# Patient Record
Sex: Female | Born: 1960
Health system: Southern US, Community
[De-identification: ages and names within clinical notes are randomized; demographics above are authoritative.]

## PROBLEM LIST (undated history)

## (undated) DIAGNOSIS — I10 Essential (primary) hypertension: Secondary | ICD-10-CM

## (undated) DIAGNOSIS — E119 Type 2 diabetes mellitus without complications: Secondary | ICD-10-CM

## (undated) DIAGNOSIS — E785 Hyperlipidemia, unspecified: Secondary | ICD-10-CM

## (undated) HISTORY — DX: Hyperlipidemia, unspecified: E78.5

## (undated) HISTORY — DX: Type 2 diabetes mellitus without complications: E11.9

## (undated) HISTORY — DX: Essential (primary) hypertension: I10

---

## 2018-09-06 DIAGNOSIS — H01004 Unspecified blepharitis left upper eyelid: Secondary | ICD-10-CM | POA: Diagnosis not present

## 2018-09-06 DIAGNOSIS — H01001 Unspecified blepharitis right upper eyelid: Secondary | ICD-10-CM | POA: Diagnosis not present

## 2018-09-06 DIAGNOSIS — H2513 Age-related nuclear cataract, bilateral: Secondary | ICD-10-CM | POA: Diagnosis not present

## 2018-09-06 DIAGNOSIS — H01002 Unspecified blepharitis right lower eyelid: Secondary | ICD-10-CM | POA: Diagnosis not present

## 2018-10-31 DIAGNOSIS — E785 Hyperlipidemia, unspecified: Secondary | ICD-10-CM | POA: Diagnosis not present

## 2018-10-31 DIAGNOSIS — I10 Essential (primary) hypertension: Secondary | ICD-10-CM | POA: Diagnosis not present

## 2018-11-22 DIAGNOSIS — F332 Major depressive disorder, recurrent severe without psychotic features: Secondary | ICD-10-CM | POA: Diagnosis not present

## 2019-01-22 DIAGNOSIS — E785 Hyperlipidemia, unspecified: Secondary | ICD-10-CM | POA: Diagnosis not present

## 2019-01-22 DIAGNOSIS — I10 Essential (primary) hypertension: Secondary | ICD-10-CM | POA: Diagnosis not present

## 2019-02-25 DIAGNOSIS — E782 Mixed hyperlipidemia: Secondary | ICD-10-CM | POA: Diagnosis not present

## 2019-02-25 DIAGNOSIS — I10 Essential (primary) hypertension: Secondary | ICD-10-CM | POA: Diagnosis not present

## 2019-04-24 DIAGNOSIS — I10 Essential (primary) hypertension: Secondary | ICD-10-CM | POA: Diagnosis not present

## 2019-04-24 DIAGNOSIS — E785 Hyperlipidemia, unspecified: Secondary | ICD-10-CM | POA: Diagnosis not present

## 2019-04-24 DIAGNOSIS — Z23 Encounter for immunization: Secondary | ICD-10-CM | POA: Diagnosis not present

## 2019-04-24 DIAGNOSIS — M545 Low back pain: Secondary | ICD-10-CM | POA: Diagnosis not present

## 2019-04-24 DIAGNOSIS — Z1211 Encounter for screening for malignant neoplasm of colon: Secondary | ICD-10-CM | POA: Diagnosis not present

## 2019-04-24 DIAGNOSIS — E782 Mixed hyperlipidemia: Secondary | ICD-10-CM | POA: Diagnosis not present

## 2019-05-02 DIAGNOSIS — Z1321 Encounter for screening for nutritional disorder: Secondary | ICD-10-CM | POA: Diagnosis not present

## 2019-05-02 DIAGNOSIS — R7303 Prediabetes: Secondary | ICD-10-CM | POA: Diagnosis not present

## 2019-05-02 DIAGNOSIS — E668 Other obesity: Secondary | ICD-10-CM | POA: Diagnosis not present

## 2019-05-02 DIAGNOSIS — L409 Psoriasis, unspecified: Secondary | ICD-10-CM | POA: Diagnosis not present

## 2019-05-13 DIAGNOSIS — E782 Mixed hyperlipidemia: Secondary | ICD-10-CM | POA: Diagnosis not present

## 2019-05-13 DIAGNOSIS — Z0001 Encounter for general adult medical examination with abnormal findings: Secondary | ICD-10-CM | POA: Diagnosis not present

## 2019-05-13 DIAGNOSIS — I10 Essential (primary) hypertension: Secondary | ICD-10-CM | POA: Diagnosis not present

## 2019-05-13 DIAGNOSIS — Z833 Family history of diabetes mellitus: Secondary | ICD-10-CM | POA: Diagnosis not present

## 2019-05-18 ENCOUNTER — Other Ambulatory Visit: Payer: Self-pay | Admitting: Family Medicine

## 2019-05-18 DIAGNOSIS — Z1231 Encounter for screening mammogram for malignant neoplasm of breast: Secondary | ICD-10-CM

## 2019-05-25 DIAGNOSIS — F332 Major depressive disorder, recurrent severe without psychotic features: Secondary | ICD-10-CM | POA: Diagnosis not present

## 2019-06-06 DIAGNOSIS — Z20828 Contact with and (suspected) exposure to other viral communicable diseases: Secondary | ICD-10-CM | POA: Diagnosis not present

## 2019-06-23 DIAGNOSIS — Z124 Encounter for screening for malignant neoplasm of cervix: Secondary | ICD-10-CM | POA: Diagnosis not present

## 2019-06-23 DIAGNOSIS — Z130 Encounter for screening for diseases of the blood and blood-forming organs and certain disorders involving the immune mechanism: Secondary | ICD-10-CM | POA: Diagnosis not present

## 2019-06-23 DIAGNOSIS — Z6835 Body mass index (BMI) 35.0-35.9, adult: Secondary | ICD-10-CM | POA: Diagnosis not present

## 2019-06-23 DIAGNOSIS — Z01419 Encounter for gynecological examination (general) (routine) without abnormal findings: Secondary | ICD-10-CM | POA: Diagnosis not present

## 2019-07-11 ENCOUNTER — Ambulatory Visit
Admission: RE | Admit: 2019-07-11 | Discharge: 2019-07-11 | Disposition: A | Discharge status: Home / Self Care | Payer: BC Managed Care – PPO | Source: Ambulatory Visit | Attending: Family Medicine | Admitting: Family Medicine

## 2019-07-11 ENCOUNTER — Other Ambulatory Visit: Payer: Self-pay

## 2019-07-11 DIAGNOSIS — Z1231 Encounter for screening mammogram for malignant neoplasm of breast: Secondary | ICD-10-CM | POA: Diagnosis not present

## 2019-07-24 DIAGNOSIS — I10 Essential (primary) hypertension: Secondary | ICD-10-CM | POA: Diagnosis not present

## 2019-07-24 DIAGNOSIS — R7303 Prediabetes: Secondary | ICD-10-CM | POA: Diagnosis not present

## 2019-07-24 DIAGNOSIS — Z833 Family history of diabetes mellitus: Secondary | ICD-10-CM | POA: Diagnosis not present

## 2019-07-24 DIAGNOSIS — E782 Mixed hyperlipidemia: Secondary | ICD-10-CM | POA: Diagnosis not present

## 2019-08-09 DIAGNOSIS — Z833 Family history of diabetes mellitus: Secondary | ICD-10-CM | POA: Diagnosis not present

## 2019-08-09 DIAGNOSIS — I10 Essential (primary) hypertension: Secondary | ICD-10-CM | POA: Diagnosis not present

## 2019-08-09 DIAGNOSIS — R7303 Prediabetes: Secondary | ICD-10-CM | POA: Diagnosis not present

## 2019-08-09 DIAGNOSIS — E782 Mixed hyperlipidemia: Secondary | ICD-10-CM | POA: Diagnosis not present

## 2019-10-11 DIAGNOSIS — I10 Essential (primary) hypertension: Secondary | ICD-10-CM | POA: Diagnosis not present

## 2019-10-11 DIAGNOSIS — R7303 Prediabetes: Secondary | ICD-10-CM | POA: Diagnosis not present

## 2019-10-11 DIAGNOSIS — E7800 Pure hypercholesterolemia, unspecified: Secondary | ICD-10-CM | POA: Diagnosis not present

## 2019-10-19 DIAGNOSIS — H2513 Age-related nuclear cataract, bilateral: Secondary | ICD-10-CM | POA: Diagnosis not present

## 2019-10-19 DIAGNOSIS — H524 Presbyopia: Secondary | ICD-10-CM | POA: Diagnosis not present

## 2019-10-19 DIAGNOSIS — H0100A Unspecified blepharitis right eye, upper and lower eyelids: Secondary | ICD-10-CM | POA: Diagnosis not present

## 2019-10-19 DIAGNOSIS — E119 Type 2 diabetes mellitus without complications: Secondary | ICD-10-CM | POA: Diagnosis not present

## 2019-11-09 DIAGNOSIS — I10 Essential (primary) hypertension: Secondary | ICD-10-CM | POA: Diagnosis not present

## 2019-11-09 DIAGNOSIS — R7303 Prediabetes: Secondary | ICD-10-CM | POA: Diagnosis not present

## 2019-11-09 DIAGNOSIS — Z23 Encounter for immunization: Secondary | ICD-10-CM | POA: Diagnosis not present

## 2019-11-09 DIAGNOSIS — Z1389 Encounter for screening for other disorder: Secondary | ICD-10-CM | POA: Diagnosis not present

## 2019-11-16 DIAGNOSIS — H6122 Impacted cerumen, left ear: Secondary | ICD-10-CM | POA: Diagnosis not present

## 2019-11-22 DIAGNOSIS — F339 Major depressive disorder, recurrent, unspecified: Secondary | ICD-10-CM | POA: Diagnosis not present

## 2019-11-22 DIAGNOSIS — Z79899 Other long term (current) drug therapy: Secondary | ICD-10-CM | POA: Diagnosis not present

## 2020-01-13 DIAGNOSIS — H6120 Impacted cerumen, unspecified ear: Secondary | ICD-10-CM | POA: Diagnosis not present

## 2020-02-16 DIAGNOSIS — Z23 Encounter for immunization: Secondary | ICD-10-CM | POA: Diagnosis not present

## 2020-03-31 DIAGNOSIS — Z20822 Contact with and (suspected) exposure to covid-19: Secondary | ICD-10-CM | POA: Diagnosis not present

## 2020-04-26 DIAGNOSIS — R7303 Prediabetes: Secondary | ICD-10-CM | POA: Diagnosis not present

## 2020-04-26 DIAGNOSIS — Z23 Encounter for immunization: Secondary | ICD-10-CM | POA: Diagnosis not present

## 2020-04-26 DIAGNOSIS — I10 Essential (primary) hypertension: Secondary | ICD-10-CM | POA: Diagnosis not present

## 2020-04-26 DIAGNOSIS — L308 Other specified dermatitis: Secondary | ICD-10-CM | POA: Diagnosis not present

## 2020-04-26 DIAGNOSIS — E7800 Pure hypercholesterolemia, unspecified: Secondary | ICD-10-CM | POA: Diagnosis not present

## 2020-05-07 DIAGNOSIS — T148XXA Other injury of unspecified body region, initial encounter: Secondary | ICD-10-CM | POA: Diagnosis not present

## 2020-05-07 DIAGNOSIS — R079 Chest pain, unspecified: Secondary | ICD-10-CM | POA: Diagnosis not present

## 2020-05-10 DIAGNOSIS — F411 Generalized anxiety disorder: Secondary | ICD-10-CM | POA: Diagnosis not present

## 2020-05-10 DIAGNOSIS — F332 Major depressive disorder, recurrent severe without psychotic features: Secondary | ICD-10-CM | POA: Diagnosis not present

## 2020-05-10 DIAGNOSIS — Z79899 Other long term (current) drug therapy: Secondary | ICD-10-CM | POA: Diagnosis not present

## 2020-06-26 DIAGNOSIS — Z01419 Encounter for gynecological examination (general) (routine) without abnormal findings: Secondary | ICD-10-CM | POA: Diagnosis not present

## 2020-06-26 DIAGNOSIS — Z1389 Encounter for screening for other disorder: Secondary | ICD-10-CM | POA: Diagnosis not present

## 2020-06-26 DIAGNOSIS — Z1231 Encounter for screening mammogram for malignant neoplasm of breast: Secondary | ICD-10-CM | POA: Diagnosis not present

## 2020-06-26 DIAGNOSIS — Z130 Encounter for screening for diseases of the blood and blood-forming organs and certain disorders involving the immune mechanism: Secondary | ICD-10-CM | POA: Diagnosis not present

## 2020-06-26 DIAGNOSIS — Z780 Asymptomatic menopausal state: Secondary | ICD-10-CM | POA: Diagnosis not present

## 2020-07-04 ENCOUNTER — Other Ambulatory Visit: Payer: Self-pay | Admitting: Obstetrics and Gynecology

## 2020-07-04 DIAGNOSIS — R928 Other abnormal and inconclusive findings on diagnostic imaging of breast: Secondary | ICD-10-CM

## 2020-07-05 ENCOUNTER — Other Ambulatory Visit: Payer: Self-pay

## 2020-07-05 ENCOUNTER — Ambulatory Visit: Payer: BC Managed Care – PPO

## 2020-07-05 ENCOUNTER — Other Ambulatory Visit: Payer: Self-pay | Admitting: Obstetrics and Gynecology

## 2020-07-05 ENCOUNTER — Ambulatory Visit
Admission: RE | Admit: 2020-07-05 | Discharge: 2020-07-05 | Disposition: A | Discharge status: Home / Self Care | Payer: BC Managed Care – PPO | Source: Ambulatory Visit | Attending: Obstetrics and Gynecology | Admitting: Obstetrics and Gynecology

## 2020-07-05 DIAGNOSIS — R928 Other abnormal and inconclusive findings on diagnostic imaging of breast: Secondary | ICD-10-CM

## 2020-07-05 DIAGNOSIS — R921 Mammographic calcification found on diagnostic imaging of breast: Secondary | ICD-10-CM | POA: Diagnosis not present

## 2020-07-20 ENCOUNTER — Other Ambulatory Visit: Payer: Self-pay

## 2020-07-20 ENCOUNTER — Other Ambulatory Visit: Payer: Self-pay | Admitting: Physician Assistant

## 2020-07-20 ENCOUNTER — Ambulatory Visit
Admission: RE | Admit: 2020-07-20 | Discharge: 2020-07-20 | Disposition: A | Discharge status: Home / Self Care | Payer: BC Managed Care – PPO | Source: Ambulatory Visit | Attending: Obstetrics and Gynecology | Admitting: Obstetrics and Gynecology

## 2020-07-20 ENCOUNTER — Ambulatory Visit
Admission: RE | Admit: 2020-07-20 | Discharge: 2020-07-20 | Disposition: A | Discharge status: Home / Self Care | Payer: BC Managed Care – PPO | Source: Ambulatory Visit | Attending: Physician Assistant | Admitting: Physician Assistant

## 2020-07-20 DIAGNOSIS — R921 Mammographic calcification found on diagnostic imaging of breast: Secondary | ICD-10-CM

## 2020-07-20 DIAGNOSIS — M25551 Pain in right hip: Secondary | ICD-10-CM | POA: Diagnosis not present

## 2020-07-20 DIAGNOSIS — R52 Pain, unspecified: Secondary | ICD-10-CM

## 2020-07-20 DIAGNOSIS — M1611 Unilateral primary osteoarthritis, right hip: Secondary | ICD-10-CM | POA: Diagnosis not present

## 2020-07-20 DIAGNOSIS — M545 Low back pain, unspecified: Secondary | ICD-10-CM | POA: Diagnosis not present

## 2020-07-20 DIAGNOSIS — R928 Other abnormal and inconclusive findings on diagnostic imaging of breast: Secondary | ICD-10-CM | POA: Diagnosis not present

## 2020-07-20 DIAGNOSIS — M160 Bilateral primary osteoarthritis of hip: Secondary | ICD-10-CM | POA: Diagnosis not present

## 2020-07-20 DIAGNOSIS — N6011 Diffuse cystic mastopathy of right breast: Secondary | ICD-10-CM | POA: Diagnosis not present

## 2020-07-20 DIAGNOSIS — R920 Mammographic microcalcification found on diagnostic imaging of breast: Secondary | ICD-10-CM | POA: Diagnosis not present

## 2020-08-13 ENCOUNTER — Other Ambulatory Visit: Payer: Self-pay

## 2020-08-13 ENCOUNTER — Ambulatory Visit: Payer: BC Managed Care – PPO | Attending: Physician Assistant | Admitting: Physical Therapy

## 2020-08-13 ENCOUNTER — Encounter: Payer: Self-pay | Admitting: Physical Therapy

## 2020-08-13 DIAGNOSIS — R2689 Other abnormalities of gait and mobility: Secondary | ICD-10-CM | POA: Insufficient documentation

## 2020-08-13 DIAGNOSIS — G8929 Other chronic pain: Secondary | ICD-10-CM | POA: Diagnosis not present

## 2020-08-13 DIAGNOSIS — M5441 Lumbago with sciatica, right side: Secondary | ICD-10-CM | POA: Insufficient documentation

## 2020-08-13 DIAGNOSIS — M25551 Pain in right hip: Secondary | ICD-10-CM | POA: Diagnosis not present

## 2020-08-14 ENCOUNTER — Encounter: Payer: Self-pay | Admitting: Physical Therapy

## 2020-08-14 NOTE — Therapy (Signed)
[facility] [address] [place], [place], [address] Phone: [phone]   Fax:  [phone]  Physical Therapy Evaluation  Patient Details  Name: Sara Andersen MRN: [id number] Date of Birth: 09-12-1960 Referring Provider (PT): Mosetta Putt PA   Encounter Date: 08/13/2020   PT End of Session - 08/13/20 1602    Visit Number 1    Number of Visits 12    Date for PT Re-Evaluation 09/24/20    PT Start Time 1545    PT Stop Time 1638    PT Time Calculation (min) 53 min    Activity Tolerance Patient tolerated treatment well    Behavior During Therapy [facility] for tasks assessed/performed           Past Medical History:  Diagnosis Date  . Diabetes mellitus without complication (HCC)   . Hyperlipidemia   . Hypertension     History reviewed. No pertinent surgical history.  There were no vitals filed for this visit.    Subjective Assessment - 08/13/20 1551    Subjective For the past 2 yrears the patient has been expieincing Low back apnd right hip pain. She has a catching at times when she walks. She can also stiffne if she stits for too long.    Limitations Sitting;Lifting;Standing;Walking    Diagnostic tests Thea Silversmith spine: mild to moderate scoliosis; L2-L3 L3 L4 degeneration: Right hip: mild degeenration    Patient Stated Goals to have less pain. To prevent further deteriation of mobility    Currently in Pain? Yes    Pain Score 5     Pain Location Hip    Pain Orientation Right    Pain Descriptors / Indicators Aching    Pain Type Chronic pain    Pain Onset More than a month ago    Pain Frequency Constant    Aggravating Factors  standing, walking, sitting for too long    Pain Relieving Factors advil, and ice    Effect of Pain on Daily Activities difficulty perfroming daily activity at times              [facility] PT Assessment - 08/14/20 0001      Assessment   Medical Diagnosis Right Hip and Low Back pain    Referring  Provider (PT) Deidre Ala Clelland PA    Onset Date/Surgical Date --   > 2 years   Hand Dominance Right    Next MD Visit May 11th    Prior Therapy None      Precautions   Precautions None      Restrictions   Weight Bearing Restrictions No      Balance Screen   Has the patient fallen in the past 6 months No    Has the patient had a decrease in activity level because of a fear of falling?  No    Is the patient reluctant to leave their home because of a fear of falling?  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    Leisure nothing      Cognition   Overall Cognitive Status Within Functional Limits for tasks assessed    Attention Focused    Memory Appears  intact    Awareness Appears intact    Problem Solving Appears intact      Observation/Other Assessments   Observations Right shoulder elevation    Scoliosis noted in x-ray    Focus on Therapeutic Outcomes (FOTO)  55%  ABILITY 70 % EXPECTED      Sensation   Light Touch Appears Intact    Additional Comments denies parathesias; pain can radiate into the right leg      Coordination   Gross Motor Movements are Fluid and Coordinated Yes    Fine Motor Movements are Fluid and Coordinated Yes      AROM   Overall AROM Comments pain with active hip flexion on the right. pain with active hip IR      PROM   Overall PROM Comments pain at 80 degrees of hip flexion but able to move to 110 degrees      Strength   Strength Assessment Site Hip    Right/Left Hip Right;Left    Right Hip Flexion 4+/5    Right Hip ABduction 4+/5    Left Hip Flexion 5/5    Left Hip ABduction 5/5      Palpation   Palpation comment tender to palpation in the right gresater trochanter; tender to palpation in the gluteal       Special Tests   Other special tests Hip scour test (-)      Ambulation/Gait   Gait Comments mild hiking of the right hip with ambualtion, mild lateral movement                      Objective measurements completed on examination: See above findings.       [facility] Adult PT Treatment/Exercise - 08/14/20 0001      Lumbar Exercises: Stretches   Lower Trunk Rotation Limitations x20    Piriformis Stretch Limitations 2x20 sec hold      Lumbar Exercises: Supine   Other Supine Lumbar Exercises bridge  x20                  PT Education - 08/14/20 0810    Education Details reviewed HEP and symptom mangement    Person(s) Educated Patient    Methods Explanation;Demonstration;Verbal cues;Tactile cues    Comprehension Verbalized understanding;Returned demonstration;Verbal cues required;Tactile cues required            PT Short Term Goals - 08/14/20 0831      PT SHORT TERM GOAL #1   Title Patient will increase pasive right hip flexion 2to 120 dgrees without pain    Time 3    Period Weeks    Status New    Target Date 09/04/20      PT SHORT TERM GOAL #2   Title Patient will increase gross right LE strength to 5/5    Time 3    Period Weeks    Status New    Target Date 09/04/20      PT SHORT TERM GOAL #3   Title Patient will be independent with basic hip stretching and strengthening program    Time 3    Period Weeks    Status New    Target Date 09/04/20      PT SHORT TERM GOAL #4   Title Therapy will review FOTO with the patient             PT Long Term Goals - 08/13/20 1604      PT LONG TERM GOAL #1  Title Patient will go up/down 5 steps with reciprocal gait pattern.    Time 6    Period Weeks    Status New    Target Date 09/24/20      PT LONG TERM GOAL #2   Title Patient will sit at her desk for 1 hour without increased hip pain    Time 6    Period Weeks    Status New    Target Date 09/24/20                  Plan - 08/13/20  1604    Clinical Impression Statement Patient is a 60 year old female with right hip and right sided low back pain that at times can radiate into her knee. She had mild degeneration in her spine and hip as well as mild to moderate scoliosis per x-ray. She presents with limited right hip flexion and IR. She also has mild strength limitations. She has pain over her greater trochanter. She has pain going up and down the stairs and often feels a catch in her hip. She would benefit from skilled therapy to improve the strength and stability of her back and hip, and to help her perfrom functional activity.    Personal Factors and Comorbidities Fitness;Comorbidity 1    Comorbidities lumbar OA,    Examination-Activity Limitations Locomotion Level;Sleep;Stairs;Stand    Examination-Participation Restrictions Occupation;Shop;Community Activity;Cleaning    Stability/Clinical Decision Making Evolving/Moderate complexity    Clinical Decision Making Moderate    Rehab Potential Good    PT Frequency 1x / week    PT Duration 4 weeks    PT Treatment/Interventions ADLs/Self Care Home Management;Electrical Stimulation;Iontophoresis 4mg /ml Dexamethasone;Moist Heat;Traction;Ultrasound;Cryotherapy;DME Instruction;Gait training;Functional mobility training;Stair training;Therapeutic activities;Therapeutic exercise;Neuromuscular re-education;Prosthetic Training;Manual techniques;Passive range of motion;Taping;Spinal Manipulations    PT Next Visit Plan LAAD with occilations of her right LE. PA mobilizations of the right hip to improve pain free flexion; soft tissue mobilization to lumbar spine; consider lower lumbar PA's. Advance core strengthening; consider postural exercises for core strengthening such as row and pull down.    PT Home Exercise Plan prifromis stethc ; LTR; bridge    Consulted and Agree with Plan of Care Patient           Patient will benefit from skilled therapeutic intervention in order to improve the  following deficits and impairments:  Decreased range of motion,Difficulty walking,Increased fascial restricitons,Pain,Decreased mobility,Decreased strength,Decreased activity tolerance,Impaired perceived functional ability,Decreased endurance  Visit Diagnosis: Pain in right hip  Other abnormalities of gait and mobility  Chronic right-sided low back pain with right-sided sciatica     Problem List There are no problems to display for this patient.   PT DPT  08/14/2020, 9:19 AM  [facility] [address] [place], [place], [place] Phone: [phone]   Fax:  [phone]  Name: Sara Andersen MRN: Walker Shadow Date of Birth: Apr 23, 1961

## 2020-08-27 ENCOUNTER — Ambulatory Visit: Payer: BC Managed Care – PPO | Admitting: Physical Therapy

## 2020-08-28 ENCOUNTER — Other Ambulatory Visit: Payer: Self-pay

## 2020-08-28 ENCOUNTER — Ambulatory Visit
Payer: BC Managed Care – PPO | Attending: Physician Assistant | Admitting: Rehabilitative and Restorative Service Providers"

## 2020-08-28 ENCOUNTER — Encounter: Payer: Self-pay | Admitting: Rehabilitative and Restorative Service Providers"

## 2020-08-28 DIAGNOSIS — M25551 Pain in right hip: Secondary | ICD-10-CM | POA: Diagnosis not present

## 2020-08-28 DIAGNOSIS — G8929 Other chronic pain: Secondary | ICD-10-CM | POA: Diagnosis not present

## 2020-08-28 DIAGNOSIS — M5441 Lumbago with sciatica, right side: Secondary | ICD-10-CM | POA: Insufficient documentation

## 2020-08-28 DIAGNOSIS — R2689 Other abnormalities of gait and mobility: Secondary | ICD-10-CM | POA: Insufficient documentation

## 2020-08-28 NOTE — Therapy (Signed)
[facility] [address] [place], [place], [address] Phone: [phone]   Fax:  [phone]  Physical Therapy Treatment  Patient Details  Name: Sara Andersen MRN: [id number] Date of Birth: 02-11-1961 Referring Provider (PT): Mosetta Putt PA   Encounter Date: 08/28/2020   PT End of Session - 08/28/20 1617    Visit Number 2    Number of Visits 12    PT Start Time 0349    PT Stop Time 0437    PT Time Calculation (min) 48 min    Activity Tolerance Patient tolerated treatment well;No increased pain    Behavior During Therapy WFL for tasks assessed/performed           Past Medical History:  Diagnosis Date  . Diabetes mellitus without complication (HCC)   . Hyperlipidemia   . Hypertension     History reviewed. No pertinent surgical history.  There were no vitals filed for this visit.   Subjective Assessment - 08/28/20 1555    Subjective It is more sore today; not sharp pain. I did the exercises on both sides at home to stay even.    Currently in Pain? Yes    Pain Score 2     Pain Location Back    Pain Orientation Mid    Pain Descriptors / Indicators Aching;Tightness    Pain Type Chronic pain    Pain Onset More than a month ago    Pain Frequency Constant    Multiple Pain Sites No                             [facility] Adult PT Treatment/Exercise - 08/28/20 0001      Lumbar Exercises: Standing   Other Standing Lumbar Exercises hip flexor stretch against wall 2x30 sec      Lumbar Exercises: Seated   Other Seated Lumbar Exercises trunk flexion 3x30 sec with legs apart      Lumbar Exercises: Supine   Other Supine Lumbar Exercises LTR x 10 with 2-3 sec hold; hamstring stretch with towel 3x30 sec; Piriformis stretch 3x30 sec; bridge x 20 with tilt; tilt with glute set and clam shell x 15; tilt with SLR x 15; tilt with march x 20; tilt with oblique reach x 15 each side                  PT  Education - 08/28/20 1616    Education Details Discussed with pt flipping and rotating mattress by her brother and nephew and placing a towel roll at the base of her pillow for cervical support, discussed maintaining tilt with all transitional movements and with standing and especially pushing cart at work; discussed heating back and hip for 15-20 min while awake and temp is comfy    Person(s) Educated Patient    Methods Explanation    Comprehension Verbalized understanding;Returned demonstration            PT Short Term Goals - 08/28/20 1627      PT SHORT TERM GOAL #1   Title Patient will increase pasive right hip flexion 2to 120 dgrees without pain    Status On-going      PT SHORT TERM GOAL #2   Title Patient will increase gross right LE strength to 5/5    Status On-going      PT SHORT TERM GOAL #3   Title Patient will be independent with basic hip stretching and strengthening program  Status On-going      PT SHORT TERM GOAL #4   Title Therapy will review FOTO with the patient    Status On-going             PT Long Term Goals - 08/13/20 1604      PT LONG TERM GOAL #1   Title Patient will go up/down 5 steps with reciprocal gait pattern.    Time 6    Period Weeks    Status New    Target Date 09/24/20      PT LONG TERM GOAL #2   Title Patient will sit at her desk for 1 hour without increased hip pain    Time 6    Period Weeks    Status New    Target Date 09/24/20                 Plan - 08/28/20 1617    Clinical Impression Statement Pt presents to PT with second treatment for R sided LBP and R hip pain. . She had mild degeneration in her spine and hip as well as mild to moderate scoliosis per x-ray. She presents with limited right hip flexion and IR. She also has mild strength limitations. She has pain over her greater trochanter. She has pain going up and down the stairs and often feels a catch in her hip. She would benefit from skilled therapy to improve  the strength and stability of her back and hip, and to help her perfrom functional activity.    PT Treatment/Interventions ADLs/Self Care Home Management;Electrical Stimulation;Iontophoresis 4mg /ml Dexamethasone;Moist Heat;Traction;Ultrasound;Cryotherapy;DME Instruction;Gait training;Functional mobility training;Stair training;Therapeutic activities;Therapeutic exercise;Neuromuscular re-education;Prosthetic Training;Manual techniques;Passive range of motion;Taping;Spinal Manipulations    PT Next Visit Plan LAAD with occilations of her right LE. PA mobilizations of the right hip to improve pain free flexion; soft tissue mobilization to lumbar spine; consider lower lumbar PA's. Advance core strengthening; consider postural exercises for core strengthening such as row and pull down.    Consulted and Agree with Plan of Care Patient           Patient will benefit from skilled therapeutic intervention in order to improve the following deficits and impairments:  Decreased range of motion,Difficulty walking,Increased fascial restricitons,Pain,Decreased mobility,Decreased strength,Decreased activity tolerance,Impaired perceived functional ability,Decreased endurance  Visit Diagnosis: Pain in right hip  Other abnormalities of gait and mobility  Chronic right-sided low back pain with right-sided sciatica     Problem List There are no problems to display for this patient.   , PT 08/28/2020, 6:02 PM  [facility] [address] [place], [place], [place] Phone: [phone]   Fax:  [phone]  Name: Joyous Gleghorn MRN: Walker Shadow Date of Birth: 11/07/1960

## 2020-09-03 ENCOUNTER — Ambulatory Visit: Payer: BC Managed Care – PPO | Admitting: Physical Therapy

## 2020-09-05 ENCOUNTER — Encounter: Payer: Self-pay | Admitting: Physical Therapy

## 2020-09-05 ENCOUNTER — Other Ambulatory Visit: Payer: Self-pay

## 2020-09-05 ENCOUNTER — Ambulatory Visit: Payer: BC Managed Care – PPO | Admitting: Physical Therapy

## 2020-09-05 DIAGNOSIS — G8929 Other chronic pain: Secondary | ICD-10-CM

## 2020-09-05 DIAGNOSIS — M25551 Pain in right hip: Secondary | ICD-10-CM

## 2020-09-05 DIAGNOSIS — R2689 Other abnormalities of gait and mobility: Secondary | ICD-10-CM

## 2020-09-05 DIAGNOSIS — M5441 Lumbago with sciatica, right side: Secondary | ICD-10-CM | POA: Diagnosis not present

## 2020-09-06 ENCOUNTER — Encounter: Payer: Self-pay | Admitting: Physical Therapy

## 2020-09-06 NOTE — Therapy (Signed)
[facility] [address] [place], [place], [address] Phone: [phone]   Fax:  [phone]  Physical Therapy Treatment  Patient Details  Name: Sara Andersen MRN: [id number] Date of Birth: 08-09-1960 Referring Provider (PT): Mosetta Putt PA   Encounter Date: 09/05/2020   PT End of Session - 09/05/20 1723    Visit Number 3    Number of Visits 12    Date for PT Re-Evaluation 09/24/20    PT Start Time 1718    PT Stop Time 1800    PT Time Calculation (min) 42 min    Activity Tolerance Patient tolerated treatment well;No increased pain    Behavior During Therapy WFL for tasks assessed/performed           Past Medical History:  Diagnosis Date  . Diabetes mellitus without complication (HCC)   . Hyperlipidemia   . Hypertension     History reviewed. No pertinent surgical history.  There were no vitals filed for this visit.   Subjective Assessment - 09/05/20 1720    Subjective Patient reports her right hip is feeling better but her left hip is now hurting. She has been using her stretches to work her back nad hips out    Limitations Sitting;Lifting;Standing;Walking    Diagnostic tests Thea Silversmith spine: mild to moderate scoliosis; L2-L3 L3 L4 degeneration: Right hip: mild degeenration    Patient Stated Goals to have less pain. To prevent further deteriation of mobility    Currently in Pain? Yes    Pain Score 5     Pain Location Back    Pain Orientation Mid    Pain Descriptors / Indicators Aching    Pain Type Chronic pain    Pain Onset More than a month ago    Pain Frequency Constant    Aggravating Factors  standing , walkng, sitting    Pain Relieving Factors advil and ice    Effect of Pain on Daily Activities difficulty perfroming daily activity                             [facility] Adult PT Treatment/Exercise - 09/06/20 0001      Lumbar Exercises: Stretches   Active Hamstring Stretch Limitations  seated 2x30 sec hold    Lower Trunk Rotation Limitations x20    Piriformis Stretch Limitations 2x20 sec hold      Lumbar Exercises: Standing   Other Standing Lumbar Exercises standing hip 3 way 2x10 each leg; standing weight shift x20      Lumbar Exercises: Supine   Clam Limitations 2x10 red    Bridge Limitations 2x10      Manual Therapy   Manual Therapy Manual Traction;Passive ROM    Passive ROM PROM of the hips into flexion and IR    Manual Traction LAD bilateral                  PT Education - 09/06/20 1725    Education Details reviewed standing exercises for HEWP    Person(s) Educated Patient    Methods Explanation    Comprehension Verbalized understanding;Returned demonstration;Verbal cues required;Tactile cues required            PT Short Term Goals - 08/28/20 1627      PT SHORT TERM GOAL #1   Title Patient will increase pasive right hip flexion 2to 120 dgrees without pain    Status On-going      PT SHORT  TERM GOAL #2   Title Patient will increase gross right LE strength to 5/5    Status On-going      PT SHORT TERM GOAL #3   Title Patient will be independent with basic hip stretching and strengthening program    Status On-going      PT SHORT TERM GOAL #4   Title Therapy will review FOTO with the patient    Status On-going             PT Long Term Goals - 08/13/20 1604      PT LONG TERM GOAL #1   Title Patient will go up/down 5 steps with reciprocal gait pattern.    Time 6    Period Weeks    Status New    Target Date 09/24/20      PT LONG TERM GOAL #2   Title Patient will sit at her desk for 1 hour without increased hip pain    Time 6    Period Weeks    Status New    Target Date 09/24/20                 Plan - 09/06/20 1046    Clinical Impression Statement Patient had a minor increase in pain while lying on the table. Hr pain was more on the left today. She had no significant pain with exercises. therapy was able to add  standing exercises today. Therapy will continue to progress as tolerated.    Personal Factors and Comorbidities Fitness;Comorbidity 1    Comorbidities lumbar OA,    Examination-Activity Limitations Locomotion Level;Sleep;Stairs;Stand    Examination-Participation Restrictions Occupation;Shop;Community Activity;Cleaning    Stability/Clinical Decision Making Evolving/Moderate complexity    Clinical Decision Making Moderate    Rehab Potential Good    PT Frequency 1x / week    PT Duration 4 weeks    PT Treatment/Interventions ADLs/Self Care Home Management;Electrical Stimulation;Iontophoresis 4mg /ml Dexamethasone;Moist Heat;Traction;Ultrasound;Cryotherapy;DME Instruction;Gait training;Functional mobility training;Stair training;Therapeutic activities;Therapeutic exercise;Neuromuscular re-education;Prosthetic Training;Manual techniques;Passive range of motion;Taping;Spinal Manipulations    PT Next Visit Plan LAAD with occilations of her right LE. PA mobilizations of the right hip to improve pain free flexion; soft tissue mobilization to lumbar spine; consider lower lumbar PA's. Advance core strengthening; consider postural exercises for core strengthening such as row and pull down.    PT Home Exercise Plan prifromis stretch ; LTR; bridge    Consulted and Agree with Plan of Care Patient           Patient will benefit from skilled therapeutic intervention in order to improve the following deficits and impairments:  Decreased range of motion,Difficulty walking,Increased fascial restricitons,Pain,Decreased mobility,Decreased strength,Decreased activity tolerance,Impaired perceived functional ability,Decreased endurance  Visit Diagnosis: Pain in right hip  Other abnormalities of gait and mobility  Chronic right-sided low back pain with right-sided sciatica     Problem List There are no problems to display for this patient.   PT DPT  09/06/2020, 5:31 PM  [facility] [address] [place], [place], [place] Phone: [phone]   Fax:  [phone]  Name: Sara Andersen MRN: Walker Shadow Date of Birth: 09/14/1960

## 2020-09-10 ENCOUNTER — Ambulatory Visit: Payer: BC Managed Care – PPO | Admitting: Physical Therapy

## 2020-09-10 ENCOUNTER — Encounter: Payer: Self-pay | Admitting: Physical Therapy

## 2020-09-10 ENCOUNTER — Other Ambulatory Visit: Payer: Self-pay

## 2020-09-10 DIAGNOSIS — G8929 Other chronic pain: Secondary | ICD-10-CM

## 2020-09-10 DIAGNOSIS — R2689 Other abnormalities of gait and mobility: Secondary | ICD-10-CM | POA: Diagnosis not present

## 2020-09-10 DIAGNOSIS — M25551 Pain in right hip: Secondary | ICD-10-CM | POA: Diagnosis not present

## 2020-09-10 DIAGNOSIS — M5441 Lumbago with sciatica, right side: Secondary | ICD-10-CM | POA: Diagnosis not present

## 2020-09-11 ENCOUNTER — Encounter: Payer: Self-pay | Admitting: Physical Therapy

## 2020-09-11 NOTE — Therapy (Signed)
[facility] [address] [place], [place], [address] Phone: [phone]   Fax:  [phone]  Physical Therapy Treatment  Patient Details  Name: Sara Andersen MRN: [id number] Date of Birth: Apr 23, 1961 Referring Provider (PT): Mosetta Putt PA   Encounter Date: 09/10/2020   PT End of Session - 09/10/20 1632    Visit Number 4    Number of Visits 12    Date for PT Re-Evaluation 09/24/20    PT Start Time 1627    PT Stop Time 1710    PT Time Calculation (min) 43 min    Activity Tolerance Patient tolerated treatment well;No increased pain    Behavior During Therapy WFL for tasks assessed/performed           Past Medical History:  Diagnosis Date  . Diabetes mellitus without complication (HCC)   . Hyperlipidemia   . Hypertension     History reviewed. No pertinent surgical history.  There were no vitals filed for this visit.   Subjective Assessment - 09/10/20 1630    Subjective Patient reports she was flaitred up on Friday and it got worse over the past 2 days. It was better at work today but she had some catching in her lower back.    Limitations Sitting;Lifting;Standing;Walking    Diagnostic tests Thea Silversmith spine: mild to moderate scoliosis; L2-L3 L3 L4 degeneration: Right hip: mild degeenration    Patient Stated Goals to have less pain. To prevent further deteriation of mobility    Currently in Pain? Yes    Pain Score 4     Pain Location Back    Pain Orientation Right;Left    Pain Descriptors / Indicators Aching    Pain Type Chronic pain    Pain Onset More than a month ago    Pain Frequency Constant    Aggravating Factors  standing and walking    Pain Relieving Factors advil and ice    Effect of Pain on Daily Activities difficulty perfroming daily activity    Multiple Pain Sites No                             [facility] Adult PT Treatment/Exercise - 09/11/20 0001      Lumbar Exercises: Standing    Other Standing Lumbar Exercises scap retraction 2x10 with abdominal breathing; shoulder extension with abdominal breathing 2x10 red;      Manual Therapy   Manual Therapy Manual Traction;Passive ROM    Passive ROM PROM of the hips into flexion and IR    Manual Traction LAD bilateral                  PT Education - 09/10/20 1632    Education Details reviewed soft tissue mobilization.Advised spts to focus on with her tennis ball at home.    Person(s) Educated Patient    Methods Explanation;Demonstration    Comprehension Verbalized understanding;Returned demonstration;Verbal cues required;Tactile cues required            PT Short Term Goals - 08/28/20 1627      PT SHORT TERM GOAL #1   Title Patient will increase pasive right hip flexion 2to 120 dgrees without pain    Status On-going      PT SHORT TERM GOAL #2   Title Patient will increase gross right LE strength to 5/5    Status On-going      PT SHORT TERM GOAL #3   Title Patient  will be independent with basic hip stretching and strengthening program    Status On-going      PT SHORT TERM GOAL #4   Title Therapy will review FOTO with the patient    Status On-going             PT Long Term Goals - 08/13/20 1604      PT LONG TERM GOAL #1   Title Patient will go up/down 5 steps with reciprocal gait pattern.    Time 6    Period Weeks    Status New    Target Date 09/24/20      PT LONG TERM GOAL #2   Title Patient will sit at her desk for 1 hour without increased hip pain    Time 6    Period Weeks    Status New    Target Date 09/24/20                 Plan - 09/11/20 1045    Clinical Impression Statement Pagtien reported feeling much better after she lfet today. her pain was more focused in her back and SI. Therapy perfromed SI mobilization. She had no cavitation but imporved pain. She tolerated hter-ex well. She had reported an increase in pain with standing exercises last visit. She was given standing  eexercises but for UE core strengthening and posture. She tolerated those better.    Personal Factors and Comorbidities Fitness;Comorbidity 1    Comorbidities lumbar OA,    Examination-Activity Limitations Locomotion Level;Sleep;Stairs;Stand    Examination-Participation Restrictions Occupation;Shop;Community Activity;Cleaning    Stability/Clinical Decision Making Evolving/Moderate complexity    Clinical Decision Making Moderate    Rehab Potential Good    PT Frequency 1x / week    PT Duration 4 weeks    PT Treatment/Interventions ADLs/Self Care Home Management;Electrical Stimulation;Iontophoresis 4mg /ml Dexamethasone;Moist Heat;Traction;Ultrasound;Cryotherapy;DME Instruction;Gait training;Functional mobility training;Stair training;Therapeutic activities;Therapeutic exercise;Neuromuscular re-education;Prosthetic Training;Manual techniques;Passive range of motion;Taping;Spinal Manipulations    PT Next Visit Plan LAAD with occilations of her right LE. PA mobilizations of the right hip to improve pain free flexion; soft tissue mobilization to lumbar spine; consider lower lumbar PA's. Advance core strengthening; consider postural exercises for core strengthening such as row and pull down.    PT Home Exercise Plan prifromis stretch ; LTR; bridge; scap retraction with ab breathing, shoulder extension with abdominal breathing.    Consulted and Agree with Plan of Care Patient           Patient will benefit from skilled therapeutic intervention in order to improve the following deficits and impairments:  Decreased range of motion,Difficulty walking,Increased fascial restricitons,Pain,Decreased mobility,Decreased strength,Decreased activity tolerance,Impaired perceived functional ability,Decreased endurance  Visit Diagnosis: Pain in right hip  Other abnormalities of gait and mobility  Chronic right-sided low back pain with right-sided sciatica     Problem List There are no problems to display  for this patient.   PT DPT  09/11/2020, 1:23 PM  [facility] [address] [place], [place], [place] Phone: [phone]   Fax:  [phone]  Name: Clariza Sickman MRN: Walker Shadow Date of Birth: 07/13/1961

## 2020-09-17 ENCOUNTER — Other Ambulatory Visit: Payer: Self-pay

## 2020-09-17 ENCOUNTER — Ambulatory Visit: Payer: BC Managed Care – PPO | Attending: Physician Assistant | Admitting: Physical Therapy

## 2020-09-17 DIAGNOSIS — M5441 Lumbago with sciatica, right side: Secondary | ICD-10-CM | POA: Insufficient documentation

## 2020-09-17 DIAGNOSIS — R2689 Other abnormalities of gait and mobility: Secondary | ICD-10-CM | POA: Diagnosis not present

## 2020-09-17 DIAGNOSIS — G8929 Other chronic pain: Secondary | ICD-10-CM | POA: Insufficient documentation

## 2020-09-17 DIAGNOSIS — M25551 Pain in right hip: Secondary | ICD-10-CM | POA: Diagnosis not present

## 2020-09-18 ENCOUNTER — Encounter: Payer: Self-pay | Admitting: Physical Therapy

## 2020-09-18 NOTE — Therapy (Signed)
[facility] [address] [place], [place], [address] Phone: [phone]   Fax:  [phone]  Physical Therapy Treatment  Patient Details  Name: Sara Andersen MRN: [id number] Date of Birth: 07-01-1961 Referring Provider (PT): Mosetta Putt PA   Encounter Date: 09/17/2020   PT End of Session - 09/17/20 1656    Visit Number 5    Number of Visits 12    Date for PT Re-Evaluation 09/24/20    PT Start Time 1630    PT Stop Time 1715    PT Time Calculation (min) 45 min    Activity Tolerance Patient tolerated treatment well;No increased pain    Behavior During Therapy WFL for tasks assessed/performed           Past Medical History:  Diagnosis Date  . Diabetes mellitus without complication (HCC)   . Hyperlipidemia   . Hypertension     History reviewed. No pertinent surgical history.  There were no vitals filed for this visit.   Subjective Assessment - 09/18/20 0858    Subjective Patient reports her pain is getting better. She is still having some twinges in her right hip but it is much better then last week    Limitations Sitting;Lifting;Standing;Walking    Diagnostic tests Thea Silversmith spine: mild to moderate scoliosis; L2-L3 L3 L4 degeneration: Right hip: mild degeenration    Patient Stated Goals to have less pain. To prevent further deteriation of mobility    Currently in Pain? Yes   just twinges   Pain Score 2     Pain Location Hip    Pain Orientation Right    Pain Descriptors / Indicators Aching    Pain Type Chronic pain    Pain Onset More than a month ago    Pain Frequency Constant    Aggravating Factors  standing and walking    Pain Relieving Factors advil and ice    Effect of Pain on Daily Activities difficulty perfroming daily activity    Multiple Pain Sites No                             [facility] Adult PT Treatment/Exercise - 09/18/20 0001      Self-Care   Self-Care Other Self-Care Comments     Other Self-Care Comments  reviewed FOTO and golas going forward      Lumbar Exercises: Stretches   Active Hamstring Stretch Limitations seated 2x30 sec hold    Lower Trunk Rotation Limitations x20    Piriformis Stretch Limitations 2x20 sec hold      Lumbar Exercises: Standing   Other Standing Lumbar Exercises Standing heel raise x20; standing march 2x15    Other Standing Lumbar Exercises step up 2x10 2inc ; lateral step up 2x10      Lumbar Exercises: Seated   Other Seated Lumbar Exercises LAQ 2x10      Lumbar Exercises: Supine   Clam Limitations 2x10 red    Bridge Limitations 2x10                  PT Education - 09/18/20 0901    Education Details HEP and symptom mangement    Person(s) Educated Patient    Methods Explanation;Tactile cues;Demonstration;Verbal cues    Comprehension Verbalized understanding;Returned demonstration;Verbal cues required            PT Short Term Goals - 08/28/20 1627      PT SHORT TERM GOAL #1   Title  Patient will increase pasive right hip flexion 2to 120 dgrees without pain    Status On-going      PT SHORT TERM GOAL #2   Title Patient will increase gross right LE strength to 5/5    Status On-going      PT SHORT TERM GOAL #3   Title Patient will be independent with basic hip stretching and strengthening program    Status On-going      PT SHORT TERM GOAL #4   Title Therapy will review FOTO with the patient    Status On-going             PT Long Term Goals - 08/13/20 1604      PT LONG TERM GOAL #1   Title Patient will go up/down 5 steps with reciprocal gait pattern.    Time 6    Period Weeks    Status New    Target Date 09/24/20      PT LONG TERM GOAL #2   Title Patient will sit at her desk for 1 hour without increased hip pain    Time 6    Period Weeks    Status New    Target Date 09/24/20                 Plan - 09/18/20 0908    Clinical Impression Statement Patient tolerated treatment well. We reviewed  her FOTO score. it has gone down slightly but she misread one of the questions. Overall she feels like she is making progress. She will continue with therapy 4 more weeks.    Personal Factors and Comorbidities Fitness;Comorbidity 1    Comorbidities lumbar OA,    Examination-Activity Limitations Locomotion Level;Sleep;Stairs;Stand    Examination-Participation Restrictions Occupation;Shop;Community Activity;Cleaning    Stability/Clinical Decision Making Evolving/Moderate complexity    Clinical Decision Making Moderate    Rehab Potential Good    PT Frequency 1x / week    PT Duration 4 weeks    PT Treatment/Interventions ADLs/Self Care Home Management;Electrical Stimulation;Iontophoresis 4mg /ml Dexamethasone;Moist Heat;Traction;Ultrasound;Cryotherapy;DME Instruction;Gait training;Functional mobility training;Stair training;Therapeutic activities;Therapeutic exercise;Neuromuscular re-education;Prosthetic Training;Manual techniques;Passive range of motion;Taping;Spinal Manipulations    PT Next Visit Plan LAAD with occilations of her right LE. PA mobilizations of the right hip to improve pain free flexion; soft tissue mobilization to lumbar spine; consider lower lumbar PA's. Advance core strengthening; consider postural exercises for core strengthening such as row and pull down.    PT Home Exercise Plan prifromis stretch ; LTR; bridge; scap retraction with ab breathing, shoulder extension with abdominal breathing.    Consulted and Agree with Plan of Care Patient           Patient will benefit from skilled therapeutic intervention in order to improve the following deficits and impairments:  Decreased range of motion,Difficulty walking,Increased fascial restricitons,Pain,Decreased mobility,Decreased strength,Decreased activity tolerance,Impaired perceived functional ability,Decreased endurance  Visit Diagnosis: Pain in right hip  Other abnormalities of gait and mobility  Chronic right-sided low back  pain with right-sided sciatica     Problem List There are no problems to display for this patient.   PT DPT  09/18/2020, 9:16 AM  [facility] [address] [place], [place], [place] Phone: [phone]   Fax:  [phone]  Name: Sara Andersen MRN: Walker Shadow Date of Birth: [birth year]

## 2020-09-25 ENCOUNTER — Ambulatory Visit: Payer: BC Managed Care – PPO | Admitting: Physical Therapy

## 2020-09-25 ENCOUNTER — Encounter: Payer: Self-pay | Admitting: Physical Therapy

## 2020-09-25 ENCOUNTER — Other Ambulatory Visit: Payer: Self-pay

## 2020-09-25 DIAGNOSIS — M5441 Lumbago with sciatica, right side: Secondary | ICD-10-CM | POA: Diagnosis not present

## 2020-09-25 DIAGNOSIS — R2689 Other abnormalities of gait and mobility: Secondary | ICD-10-CM | POA: Diagnosis not present

## 2020-09-25 DIAGNOSIS — M25551 Pain in right hip: Secondary | ICD-10-CM

## 2020-09-25 DIAGNOSIS — G8929 Other chronic pain: Secondary | ICD-10-CM

## 2020-09-26 NOTE — Therapy (Signed)
[facility] [address] [place], [place], [address] Phone: [phone]   Fax:  [phone]  Physical Therapy Treatment  Patient Details  Name: Sara Andersen MRN: [id number] Date of Birth: [birth year] Referring Provider (PT): Mosetta Putt PA   Encounter Date: 09/25/2020   PT End of Session - 09/25/20 1647    Visit Number 5    Number of Visits 12    Date for PT Re-Evaluation 09/24/20    PT Start Time 1630    PT Stop Time 1710    PT Time Calculation (min) 40 min    Activity Tolerance Patient tolerated treatment well;No increased pain    Behavior During Therapy WFL for tasks assessed/performed           Past Medical History:  Diagnosis Date  . Diabetes mellitus without complication (HCC)   . Hyperlipidemia   . Hypertension     History reviewed. No pertinent surgical history.  There were no vitals filed for this visit.   Subjective Assessment - 09/25/20 1630    Subjective Patient reeports very little pain over the past few days. Just a twitnge at times. She has gotten a stool for her to keep her feetup on at work .    Limitations Sitting;Lifting;Standing;Walking    Diagnostic tests Thea Silversmith spine: mild to moderate scoliosis; L2-L3 L3 L4 degeneration: Right hip: mild degeenration    Patient Stated Goals to have less pain. To prevent further deteriation of mobility    Currently in Pain? No/denies   just a little twinge                            [facility] Adult PT Treatment/Exercise - 09/26/20 0001      Self-Care   Self-Care Other Self-Care Comments    Other Self-Care Comments  reviewed FOTO and golas going forward      Lumbar Exercises: Stretches   Active Hamstring Stretch Limitations seated 2x30 sec hold    Lower Trunk Rotation Limitations x20    Piriformis Stretch Limitations 2x20 sec hold      Lumbar Exercises: Standing   Other Standing Lumbar Exercises Standing heel raise x20;    Other  Standing Lumbar Exercises shoulder extension with abdominal breathing red 2x10 scpa retraction red 2x10      Lumbar Exercises: Seated   Other Seated Lumbar Exercises LAQ 2x10      Lumbar Exercises: Supine   Clam Limitations 2x10 red    Bridge Limitations 2x10    Other Supine Lumbar Exercises LTR x 10 with 2-3 sec hold; hamstring stretch with towel 3x30 sec; Piriformis stretch 3x30 sec; bridge x 20 with tilt; tilt with glute set and clam shell x 15; tilt with SLR x 15; tilt with march x 20; tilt with oblique reach x 15 each side      Manual Therapy   Manual Therapy Manual Traction;Passive ROM    Passive ROM PROM of the hips into flexion and IR    Manual Traction LAD bilateral                  PT Education - 09/25/20 1646    Education Details reviewed benefits of postural exercises    Person(s) Educated Patient    Methods Explanation;Demonstration;Tactile cues;Verbal cues    Comprehension Verbalized understanding;Returned demonstration;Verbal cues required;Tactile cues required            PT Short Term Goals - 08/28/20 1627  PT SHORT TERM GOAL #1   Title Patient will increase pasive right hip flexion 2to 120 dgrees without pain    Status On-going      PT SHORT TERM GOAL #2   Title Patient will increase gross right LE strength to 5/5    Status On-going      PT SHORT TERM GOAL #3   Title Patient will be independent with basic hip stretching and strengthening program    Status On-going      PT SHORT TERM GOAL #4   Title Therapy will review FOTO with the patient    Status On-going             PT Long Term Goals - 08/13/20 1604      PT LONG TERM GOAL #1   Title Patient will go up/down 5 steps with reciprocal gait pattern.    Time 6    Period Weeks    Status New    Target Date 09/24/20      PT LONG TERM GOAL #2   Title Patient will sit at her desk for 1 hour without increased hip pain    Time 6    Period Weeks    Status New    Target Date 09/24/20                  Plan - 09/26/20 0804    Clinical Impression Statement Patient came in with no pain but had pain getting onto the table. Despite pain getting onto the table she did well with exercises. she was shown UE core strengthening and postural exercises. She reported sometimes when she is at her desk she feels like her posture is slumped forward. She is making continued progress. She has been working on her exercises and stretches at home.    Personal Factors and Comorbidities Fitness;Comorbidity 1    Comorbidities lumbar OA,    Examination-Activity Limitations Locomotion Level;Sleep;Stairs;Stand    Examination-Participation Restrictions Occupation;Shop;Community Activity;Cleaning    Stability/Clinical Decision Making Evolving/Moderate complexity    Clinical Decision Making Moderate    Rehab Potential Good    PT Frequency 1x / week    PT Duration 4 weeks    PT Treatment/Interventions ADLs/Self Care Home Management;Electrical Stimulation;Iontophoresis 4mg /ml Dexamethasone;Moist Heat;Traction;Ultrasound;Cryotherapy;DME Instruction;Gait training;Functional mobility training;Stair training;Therapeutic activities;Therapeutic exercise;Neuromuscular re-education;Prosthetic Training;Manual techniques;Passive range of motion;Taping;Spinal Manipulations    PT Next Visit Plan LAAD with occilations of her right LE. PA mobilizations of the right hip to improve pain free flexion; soft tissue mobilization to lumbar spine; consider lower lumbar PA's. Advance core strengthening; consider postural exercises for core strengthening such as row and pull down.    Consulted and Agree with Plan of Care Patient           Patient will benefit from skilled therapeutic intervention in order to improve the following deficits and impairments:  Decreased range of motion,Difficulty walking,Increased fascial restricitons,Pain,Decreased mobility,Decreased strength,Decreased activity tolerance,Impaired perceived  functional ability,Decreased endurance  Visit Diagnosis: Pain in right hip  Other abnormalities of gait and mobility  Chronic right-sided low back pain with right-sided sciatica     Problem List There are no problems to display for this patient.   PT DPT  09/26/2020, 8:17 AM  [facility] [address] [place], [place], [place] Phone: [phone]   Fax:  [phone]  Name: Lucindia Lemley MRN: Walker Shadow Date of Birth: April 04, 1961

## 2020-10-09 ENCOUNTER — Ambulatory Visit: Payer: BC Managed Care – PPO | Admitting: Physical Therapy

## 2020-10-09 ENCOUNTER — Encounter: Payer: Self-pay | Admitting: Physical Therapy

## 2020-10-09 ENCOUNTER — Other Ambulatory Visit: Payer: Self-pay

## 2020-10-09 DIAGNOSIS — M25551 Pain in right hip: Secondary | ICD-10-CM

## 2020-10-09 DIAGNOSIS — G8929 Other chronic pain: Secondary | ICD-10-CM | POA: Diagnosis not present

## 2020-10-09 DIAGNOSIS — R2689 Other abnormalities of gait and mobility: Secondary | ICD-10-CM | POA: Diagnosis not present

## 2020-10-09 DIAGNOSIS — M5441 Lumbago with sciatica, right side: Secondary | ICD-10-CM

## 2020-10-10 ENCOUNTER — Encounter: Payer: Self-pay | Admitting: Physical Therapy

## 2020-10-10 NOTE — Therapy (Signed)
[place], [place], [address] Phone: [phone]   Fax:  [phone]  Physical Therapy Treatment/Discharge   Patient Details  Name: Dashae Wilcher MRN: [id number] Date of Birth: 06/23/1961 Referring Provider (PT): Imagene Riches PA   Encounter Date: 10/09/2020   PT End of Session - 10/09/20 1657    Visit Number 7    Number of Visits 12    Date for PT Re-Evaluation 09/24/20    PT Start Time 1630    PT Stop Time 1712    PT Time Calculation (min) 42 min    Activity Tolerance Patient tolerated treatment well;No increased pain    Behavior During Therapy WFL for tasks assessed/performed           Past Medical History:  Diagnosis Date  . Diabetes mellitus without complication ([place])   . Hyperlipidemia   . Hypertension     History reviewed. No pertinent surgical history.  There were no vitals filed for this visit.   Subjective Assessment - 10/09/20 1639    Subjective Patient has been owrking overtime on Saturdays. She felt fine fduring the shifts but has had pain the next day for a few days. She continues to feel catching but it is intemittent.    Limitations Sitting;Lifting;Standing;Walking    Diagnostic tests Myer Haff spine: mild to moderate scoliosis; L2-L3 L3 L4 degeneration: Right hip: mild degeenration    Patient Stated Goals to have less pain. To prevent further deteriation of mobility    Currently in Pain? No/denies                             [facility] Adult PT Treatment/Exercise - 10/10/20 0001      Lumbar Exercises: Stretches   Active Hamstring Stretch Limitations seated 2x30 sec hold    Lower Trunk Rotation Limitations x20    Piriformis Stretch Limitations 2x20 sec hold      Lumbar Exercises: Standing   Other Standing Lumbar Exercises Standing heel raise x20; standing slow march x15 each leg      Lumbar Exercises: Seated   Other Seated Lumbar Exercises LAQ 2x10     Other Seated Lumbar Exercises clamshell 2x10 red      Lumbar Exercises: Supine   Clam Limitations 2x10 red    Bridge Limitations 2x10      Manual Therapy   Passive ROM PROM of the hips into flexion and IR    Manual Traction Patient tolerated manual while on the table but reported more pain when she got up.                  PT Education - 10/10/20 1100    Education Details reviewed HEP and symptom mangement    Person(s) Educated Patient    Methods Explanation;Tactile cues;Verbal cues;Demonstration    Comprehension Verbalized understanding;Returned demonstration;Verbal cues required;Tactile cues required            PT Short Term Goals - 10/10/20 1255      PT SHORT TERM GOAL #1   Title Patient will increase pasive right hip flexion 2to 120 dgrees without pain    Baseline 120 with milld pain in the  right hip    Time 3    Period Weeks    Status Achieved      PT SHORT TERM GOAL #2   Title Patient will increase gross right LE strength to 5/5    Baseline continues to  have weakness in right hip    Time 3    Period Weeks    Status Achieved      PT SHORT TERM GOAL #3   Title Patient will be independent with basic hip stretching and strengthening program    Time 3    Period Weeks    Status On-going    Target Date 09/04/20      PT SHORT TERM GOAL #4   Title Therapy will review FOTO with the patient    Baseline reviewed FOTO    Period Weeks             PT Long Term Goals - 10/10/20 1258      PT LONG TERM GOAL #1   Title Patient will go up/down 5 steps with reciprocal gait pattern.    Time 6    Period Weeks    Status On-going      PT LONG TERM GOAL #2   Title Patient will sit at her desk for 1 hour without increased hip pain    Time 6    Period Weeks    Status On-going                 Plan - 10/10/20 1056    Clinical Impression Statement Patient had increased pain today with light manual therapy. She continues to have difficulty lying down. She  reports her pain levels fluctuate depedning on what she is doing but at times now the pain dosen;t come on until the next day. She knows all of her stretches and exercises. The work we are doing here appears to make things worse. She will continue with her home execises and D/C at this point. She will follow up with her MD in a few weeks.    Personal Factors and Comorbidities Fitness;Comorbidity 1    Comorbidities lumbar OA,    Examination-Activity Limitations Locomotion Level;Sleep;Stairs;Stand    Examination-Participation Restrictions Occupation;Shop;Community Activity;Cleaning    Stability/Clinical Decision Making Evolving/Moderate complexity    Clinical Decision Making Moderate    Rehab Potential Good    PT Frequency 1x / week    PT Duration 4 weeks    PT Treatment/Interventions ADLs/Self Care Home Management;Electrical Stimulation;Iontophoresis 28m/ml Dexamethasone;Moist Heat;Traction;Ultrasound;Cryotherapy;DME Instruction;Gait training;Functional mobility training;Stair training;Therapeutic activities;Therapeutic exercise;Neuromuscular re-education;Prosthetic Training;Manual techniques;Passive range of motion;Taping;Spinal Manipulations    PT Next Visit Plan D/C to HEP    PT Home Exercise Plan prifromis stretch ; LTR; bridge; scap retraction with ab breathing, shoulder extension with abdominal breathing.    Consulted and Agree with Plan of Care Patient           Patient will benefit from skilled therapeutic intervention in order to improve the following deficits and impairments:  Decreased range of motion,Difficulty walking,Increased fascial restricitons,Pain,Decreased mobility,Decreased strength,Decreased activity tolerance,Impaired perceived functional ability,Decreased endurance  Visit Diagnosis: Pain in right hip  Other abnormalities of gait and mobility  Chronic right-sided low back pain with right-sided sciatica  PHYSICAL THERAPY DISCHARGE SUMMARY  Visits from Start of Care:  7  Current functional level related to goals / functional outcomes: Has an HEP; pain intermittent but having more pain at times   Remaining deficits: Continues to have pain related to activity    Education / Equipment: HE Plan: Patient agrees to discharge.  Patient goals were not met. Patient is being discharged due to being pleased with the current functional level.  ?????       Problem List There are no problems to display for this patient.  Carney Living PT DPT  10/10/2020, 1:07 PM  [facility] [address] [place], [place], [address] Phone: [phone]   Fax:  [phone]  Name: Dustina Scoggin MRN: [id number] Date of Birth: 1961/06/07

## 2020-10-16 ENCOUNTER — Encounter: Payer: BC Managed Care – PPO | Admitting: Physical Therapy

## 2020-10-22 DIAGNOSIS — H0100A Unspecified blepharitis right eye, upper and lower eyelids: Secondary | ICD-10-CM | POA: Diagnosis not present

## 2020-10-22 DIAGNOSIS — E119 Type 2 diabetes mellitus without complications: Secondary | ICD-10-CM | POA: Diagnosis not present

## 2020-10-22 DIAGNOSIS — H2513 Age-related nuclear cataract, bilateral: Secondary | ICD-10-CM | POA: Diagnosis not present

## 2020-10-29 DIAGNOSIS — Z79899 Other long term (current) drug therapy: Secondary | ICD-10-CM | POA: Diagnosis not present

## 2020-10-29 DIAGNOSIS — F339 Major depressive disorder, recurrent, unspecified: Secondary | ICD-10-CM | POA: Diagnosis not present

## 2020-10-29 DIAGNOSIS — F411 Generalized anxiety disorder: Secondary | ICD-10-CM | POA: Diagnosis not present

## 2020-11-21 DIAGNOSIS — R7301 Impaired fasting glucose: Secondary | ICD-10-CM | POA: Diagnosis not present

## 2020-11-21 DIAGNOSIS — I10 Essential (primary) hypertension: Secondary | ICD-10-CM | POA: Diagnosis not present

## 2020-11-21 DIAGNOSIS — Z1211 Encounter for screening for malignant neoplasm of colon: Secondary | ICD-10-CM | POA: Diagnosis not present

## 2020-11-21 DIAGNOSIS — E7800 Pure hypercholesterolemia, unspecified: Secondary | ICD-10-CM | POA: Diagnosis not present

## 2020-11-21 DIAGNOSIS — Z131 Encounter for screening for diabetes mellitus: Secondary | ICD-10-CM | POA: Diagnosis not present

## 2021-04-25 IMAGING — MG DIGITAL SCREENING BILAT W/ TOMO W/ CAD
8 series · 8 of 24 positions shown · non-contrast
Comparison: Previous exam(s).

CLINICAL DATA: Screening.

EXAM:
DIGITAL SCREENING BILATERAL MAMMOGRAM WITH TOMO AND CAD

[L MLO synth-2D]
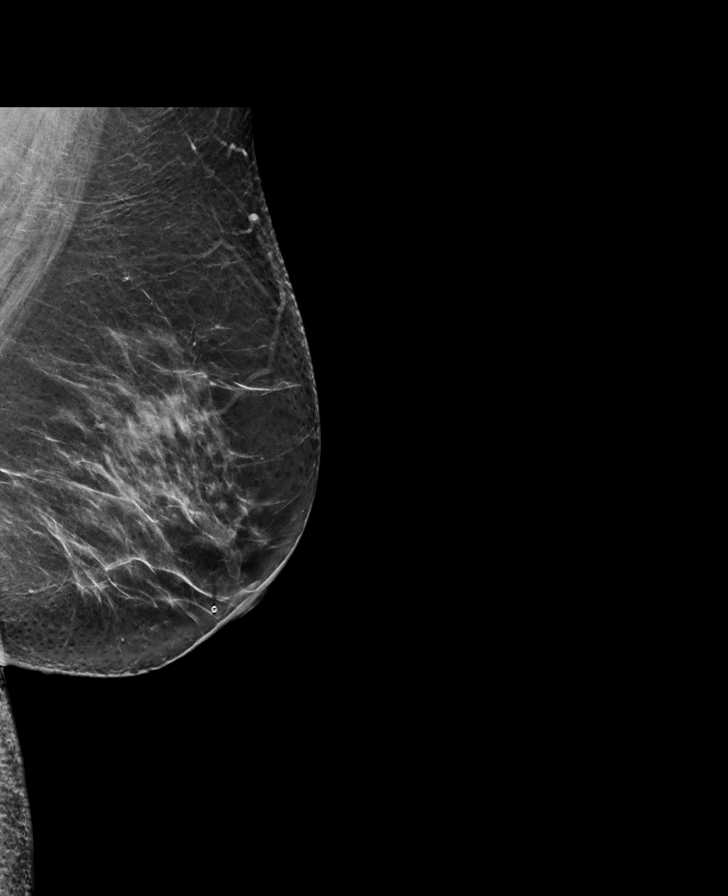

[R MLO synth-2D]
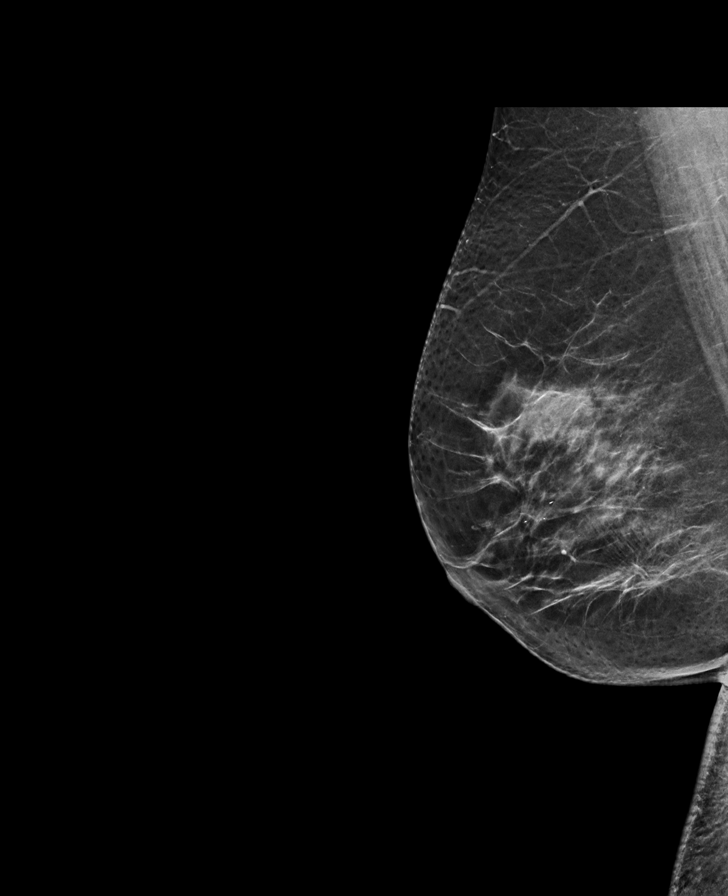

[R CC synth-2D]
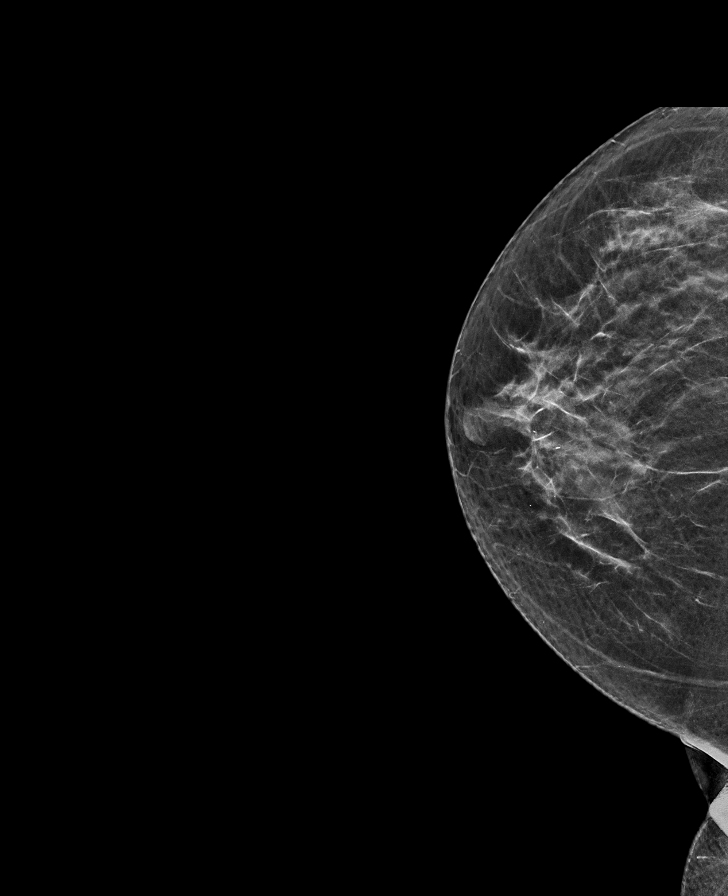

[L CC synth-2D]
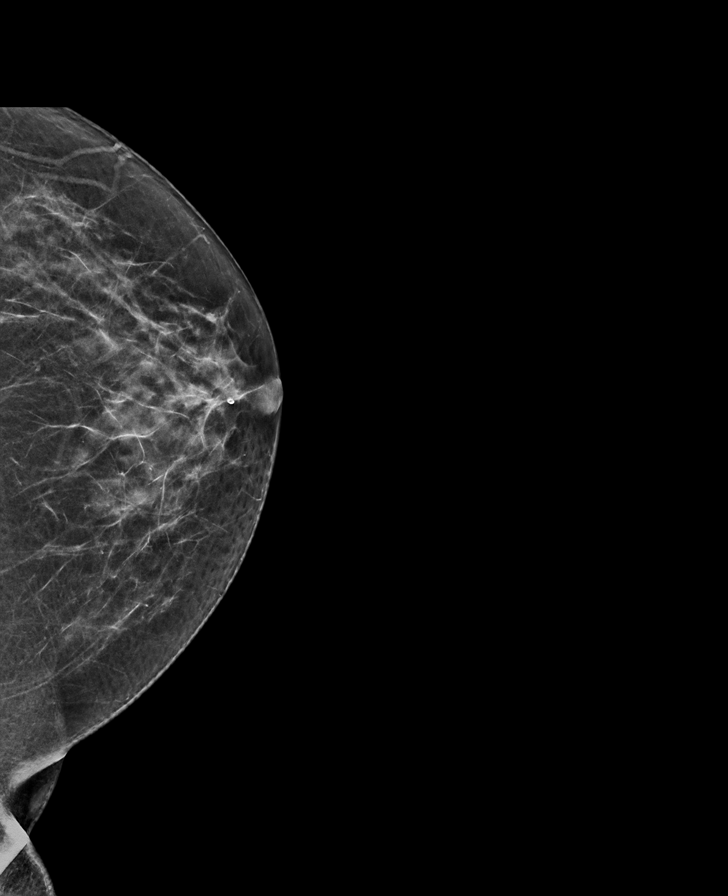

[L MLO tomo · tomo slice 37/72.0]
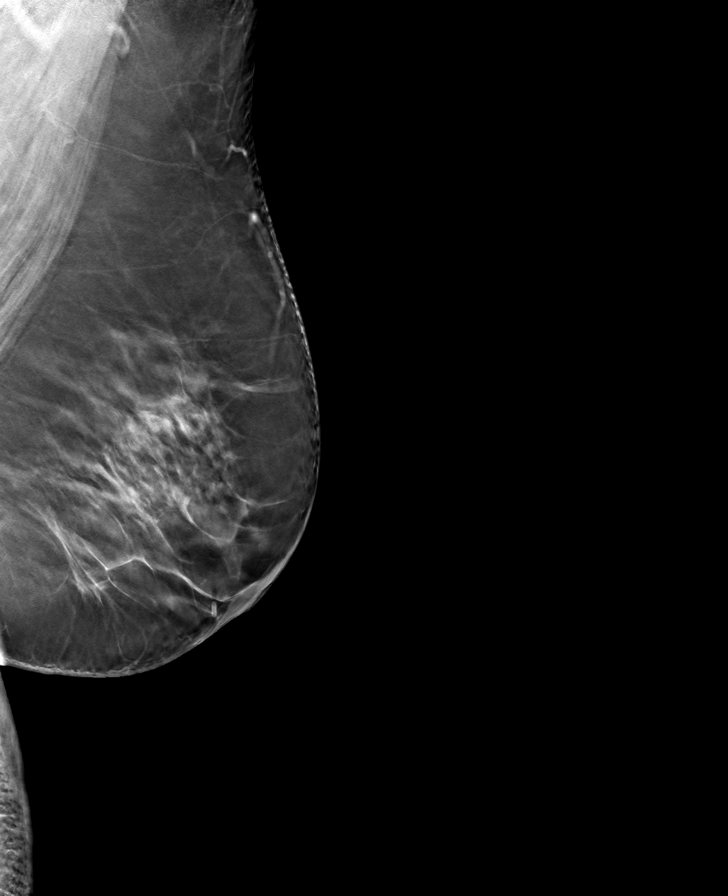

[R MLO tomo · tomo slice 34/67.0]
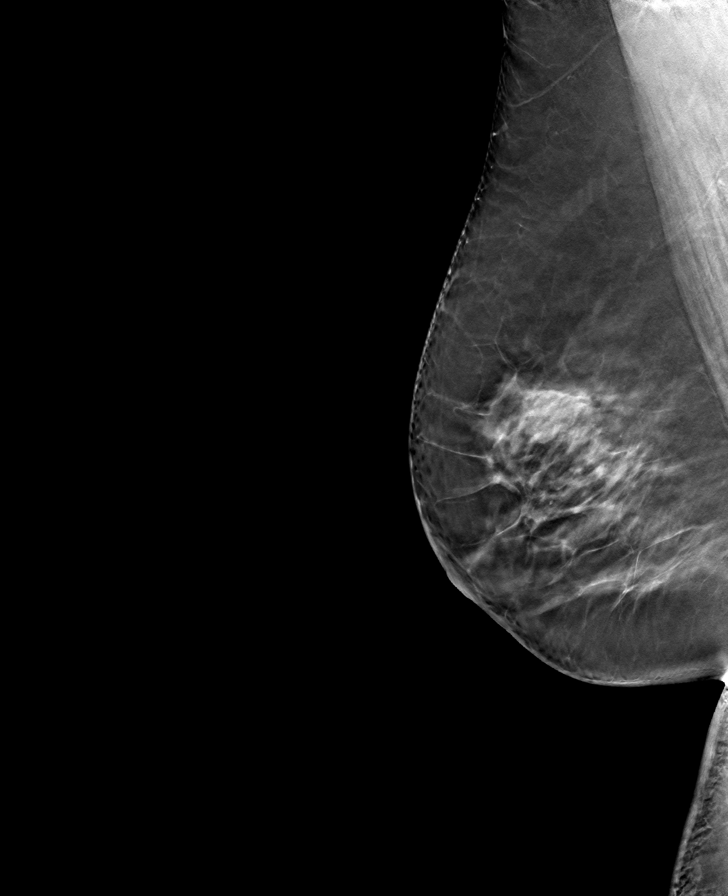

[L CC tomo · tomo slice 31/61.0]
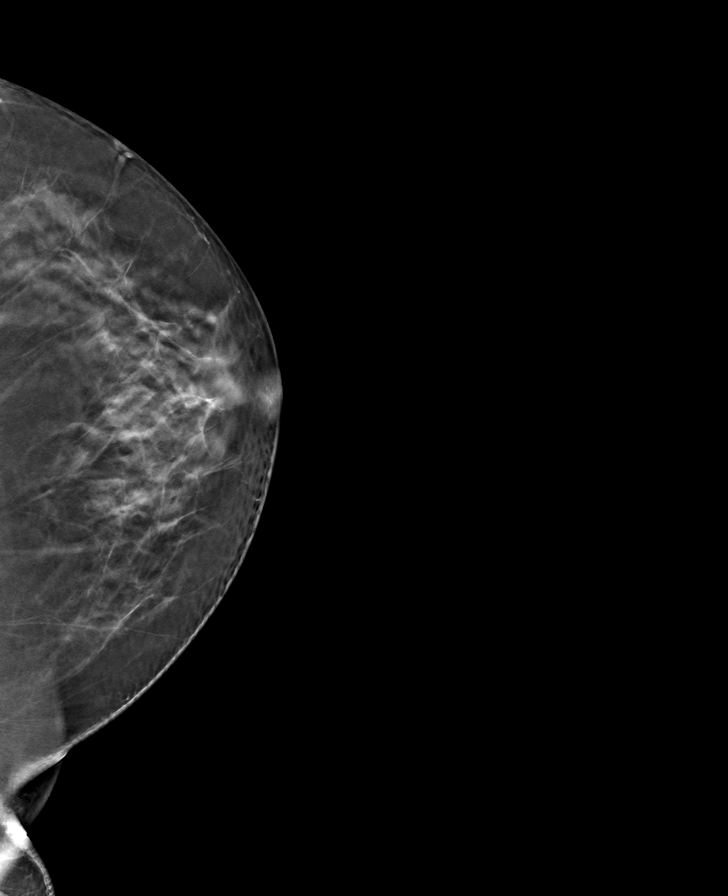

[R CC tomo · tomo slice 31/60.0]
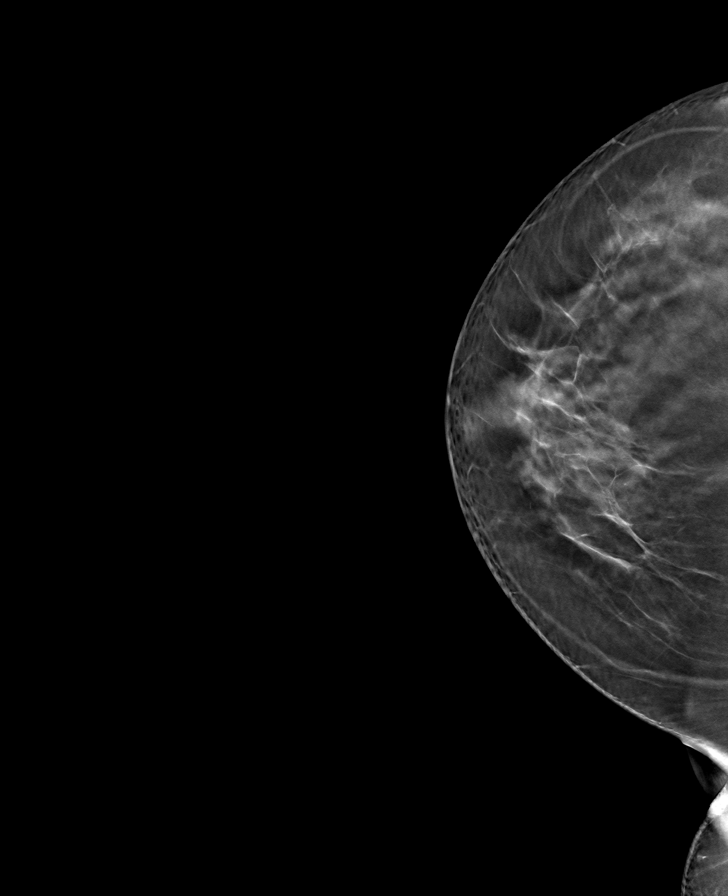

[8 of 24 positions shown; findings below may reference images not displayed]

ACR Breast Density Category c: The breast tissue is heterogeneously
dense, which may obscure small masses.
FINDINGS: There are no findings suspicious for malignancy. Images were
processed with CAD.
IMPRESSION: No mammographic evidence of malignancy. A result letter of this
screening mammogram will be mailed directly to the patient.

RECOMMENDATION:
Screening mammogram in one year. (Code:FT-U-LHB)

BI-RADS CATEGORY  1: Negative.

## 2021-05-27 DIAGNOSIS — I10 Essential (primary) hypertension: Secondary | ICD-10-CM | POA: Diagnosis not present

## 2021-05-27 DIAGNOSIS — R7301 Impaired fasting glucose: Secondary | ICD-10-CM | POA: Diagnosis not present

## 2021-05-27 DIAGNOSIS — E7800 Pure hypercholesterolemia, unspecified: Secondary | ICD-10-CM | POA: Diagnosis not present

## 2021-06-11 DIAGNOSIS — F411 Generalized anxiety disorder: Secondary | ICD-10-CM | POA: Diagnosis not present

## 2021-06-11 DIAGNOSIS — F339 Major depressive disorder, recurrent, unspecified: Secondary | ICD-10-CM | POA: Diagnosis not present

## 2021-06-11 DIAGNOSIS — Z79899 Other long term (current) drug therapy: Secondary | ICD-10-CM | POA: Diagnosis not present

## 2021-07-01 DIAGNOSIS — Z1231 Encounter for screening mammogram for malignant neoplasm of breast: Secondary | ICD-10-CM | POA: Diagnosis not present

## 2021-07-01 DIAGNOSIS — Z1389 Encounter for screening for other disorder: Secondary | ICD-10-CM | POA: Diagnosis not present

## 2021-07-01 DIAGNOSIS — Z130 Encounter for screening for diseases of the blood and blood-forming organs and certain disorders involving the immune mechanism: Secondary | ICD-10-CM | POA: Diagnosis not present

## 2021-07-01 DIAGNOSIS — Z01419 Encounter for gynecological examination (general) (routine) without abnormal findings: Secondary | ICD-10-CM | POA: Diagnosis not present

## 2021-07-01 DIAGNOSIS — N841 Polyp of cervix uteri: Secondary | ICD-10-CM | POA: Diagnosis not present

## 2021-08-20 DIAGNOSIS — Z713 Dietary counseling and surveillance: Secondary | ICD-10-CM | POA: Diagnosis not present

## 2021-09-23 DIAGNOSIS — H66001 Acute suppurative otitis media without spontaneous rupture of ear drum, right ear: Secondary | ICD-10-CM | POA: Diagnosis not present

## 2021-10-10 DIAGNOSIS — H6091 Unspecified otitis externa, right ear: Secondary | ICD-10-CM | POA: Diagnosis not present

## 2021-10-10 DIAGNOSIS — H73891 Other specified disorders of tympanic membrane, right ear: Secondary | ICD-10-CM | POA: Diagnosis not present

## 2021-10-10 DIAGNOSIS — H6123 Impacted cerumen, bilateral: Secondary | ICD-10-CM | POA: Diagnosis not present

## 2021-10-24 DIAGNOSIS — H60331 Swimmer's ear, right ear: Secondary | ICD-10-CM | POA: Diagnosis not present

## 2021-10-28 DIAGNOSIS — H0102A Squamous blepharitis right eye, upper and lower eyelids: Secondary | ICD-10-CM | POA: Diagnosis not present

## 2021-10-28 DIAGNOSIS — Z83518 Family history of other specified eye disorder: Secondary | ICD-10-CM | POA: Diagnosis not present

## 2021-10-28 DIAGNOSIS — H524 Presbyopia: Secondary | ICD-10-CM | POA: Diagnosis not present

## 2021-10-28 DIAGNOSIS — H2513 Age-related nuclear cataract, bilateral: Secondary | ICD-10-CM | POA: Diagnosis not present

## 2021-10-28 DIAGNOSIS — E119 Type 2 diabetes mellitus without complications: Secondary | ICD-10-CM | POA: Diagnosis not present

## 2021-10-31 DIAGNOSIS — H90A31 Mixed conductive and sensorineural hearing loss, unilateral, right ear with restricted hearing on the contralateral side: Secondary | ICD-10-CM | POA: Diagnosis not present

## 2021-10-31 DIAGNOSIS — H90A22 Sensorineural hearing loss, unilateral, left ear, with restricted hearing on the contralateral side: Secondary | ICD-10-CM | POA: Diagnosis not present

## 2021-11-14 DIAGNOSIS — H6521 Chronic serous otitis media, right ear: Secondary | ICD-10-CM | POA: Diagnosis not present

## 2021-11-27 DIAGNOSIS — R7301 Impaired fasting glucose: Secondary | ICD-10-CM | POA: Diagnosis not present

## 2021-11-27 DIAGNOSIS — Z0000 Encounter for general adult medical examination without abnormal findings: Secondary | ICD-10-CM | POA: Diagnosis not present

## 2021-11-27 DIAGNOSIS — I700 Atherosclerosis of aorta: Secondary | ICD-10-CM | POA: Diagnosis not present

## 2021-11-27 DIAGNOSIS — Z23 Encounter for immunization: Secondary | ICD-10-CM | POA: Diagnosis not present

## 2021-11-27 DIAGNOSIS — I10 Essential (primary) hypertension: Secondary | ICD-10-CM | POA: Diagnosis not present

## 2021-11-27 DIAGNOSIS — E7800 Pure hypercholesterolemia, unspecified: Secondary | ICD-10-CM | POA: Diagnosis not present

## 2021-11-28 DIAGNOSIS — H6981 Other specified disorders of Eustachian tube, right ear: Secondary | ICD-10-CM | POA: Diagnosis not present

## 2021-12-03 DIAGNOSIS — F411 Generalized anxiety disorder: Secondary | ICD-10-CM | POA: Diagnosis not present

## 2021-12-03 DIAGNOSIS — Z79899 Other long term (current) drug therapy: Secondary | ICD-10-CM | POA: Diagnosis not present

## 2021-12-03 DIAGNOSIS — F339 Major depressive disorder, recurrent, unspecified: Secondary | ICD-10-CM | POA: Diagnosis not present

## 2021-12-27 DIAGNOSIS — H90A22 Sensorineural hearing loss, unilateral, left ear, with restricted hearing on the contralateral side: Secondary | ICD-10-CM | POA: Diagnosis not present

## 2021-12-27 DIAGNOSIS — H903 Sensorineural hearing loss, bilateral: Secondary | ICD-10-CM | POA: Diagnosis not present

## 2021-12-27 DIAGNOSIS — H90A31 Mixed conductive and sensorineural hearing loss, unilateral, right ear with restricted hearing on the contralateral side: Secondary | ICD-10-CM | POA: Diagnosis not present

## 2021-12-30 ENCOUNTER — Other Ambulatory Visit: Payer: Self-pay | Admitting: Otolaryngology

## 2021-12-30 DIAGNOSIS — H903 Sensorineural hearing loss, bilateral: Secondary | ICD-10-CM

## 2022-01-11 ENCOUNTER — Ambulatory Visit
Admission: RE | Admit: 2022-01-11 | Discharge: 2022-01-11 | Disposition: A | Discharge status: Home / Self Care | Payer: BC Managed Care – PPO | Source: Ambulatory Visit | Attending: Otolaryngology | Admitting: Otolaryngology

## 2022-01-11 DIAGNOSIS — H903 Sensorineural hearing loss, bilateral: Secondary | ICD-10-CM

## 2022-01-11 DIAGNOSIS — H9193 Unspecified hearing loss, bilateral: Secondary | ICD-10-CM | POA: Diagnosis not present

## 2022-01-11 DIAGNOSIS — H748X3 Other specified disorders of middle ear and mastoid, bilateral: Secondary | ICD-10-CM | POA: Diagnosis not present

## 2022-01-11 MED ORDER — GADOBENATE DIMEGLUMINE 529 MG/ML IV SOLN
16.0000 mL | Freq: Once | INTRAVENOUS | Status: AC | PRN
  Administered 2022-01-11: 16 mL via INTRAVENOUS

## 2022-05-27 DIAGNOSIS — F411 Generalized anxiety disorder: Secondary | ICD-10-CM | POA: Diagnosis not present

## 2022-05-27 DIAGNOSIS — Z79899 Other long term (current) drug therapy: Secondary | ICD-10-CM | POA: Diagnosis not present

## 2022-05-27 DIAGNOSIS — F339 Major depressive disorder, recurrent, unspecified: Secondary | ICD-10-CM | POA: Diagnosis not present

## 2022-05-28 DIAGNOSIS — R7301 Impaired fasting glucose: Secondary | ICD-10-CM | POA: Diagnosis not present

## 2022-05-28 DIAGNOSIS — Z23 Encounter for immunization: Secondary | ICD-10-CM | POA: Diagnosis not present

## 2022-05-28 DIAGNOSIS — I10 Essential (primary) hypertension: Secondary | ICD-10-CM | POA: Diagnosis not present

## 2022-05-29 DIAGNOSIS — H60332 Swimmer's ear, left ear: Secondary | ICD-10-CM | POA: Diagnosis not present

## 2022-05-29 DIAGNOSIS — H6991 Unspecified Eustachian tube disorder, right ear: Secondary | ICD-10-CM | POA: Diagnosis not present

## 2022-05-29 DIAGNOSIS — H903 Sensorineural hearing loss, bilateral: Secondary | ICD-10-CM | POA: Diagnosis not present

## 2022-06-10 DIAGNOSIS — H60332 Swimmer's ear, left ear: Secondary | ICD-10-CM | POA: Diagnosis not present

## 2022-07-09 DIAGNOSIS — Z01419 Encounter for gynecological examination (general) (routine) without abnormal findings: Secondary | ICD-10-CM | POA: Diagnosis not present

## 2022-07-09 DIAGNOSIS — Z1389 Encounter for screening for other disorder: Secondary | ICD-10-CM | POA: Diagnosis not present

## 2022-07-09 DIAGNOSIS — Z1231 Encounter for screening mammogram for malignant neoplasm of breast: Secondary | ICD-10-CM | POA: Diagnosis not present

## 2022-07-09 DIAGNOSIS — Z130 Encounter for screening for diseases of the blood and blood-forming organs and certain disorders involving the immune mechanism: Secondary | ICD-10-CM | POA: Diagnosis not present

## 2022-11-18 DIAGNOSIS — F411 Generalized anxiety disorder: Secondary | ICD-10-CM | POA: Diagnosis not present

## 2022-11-18 DIAGNOSIS — F330 Major depressive disorder, recurrent, mild: Secondary | ICD-10-CM | POA: Diagnosis not present

## 2022-12-01 ENCOUNTER — Other Ambulatory Visit: Payer: Self-pay | Admitting: Physician Assistant

## 2022-12-01 ENCOUNTER — Ambulatory Visit
Admission: RE | Admit: 2022-12-01 | Discharge: 2022-12-01 | Disposition: A | Discharge status: Home / Self Care | Payer: BC Managed Care – PPO | Source: Ambulatory Visit | Attending: Physician Assistant | Admitting: Physician Assistant

## 2022-12-01 DIAGNOSIS — E7800 Pure hypercholesterolemia, unspecified: Secondary | ICD-10-CM | POA: Diagnosis not present

## 2022-12-01 DIAGNOSIS — M25512 Pain in left shoulder: Secondary | ICD-10-CM

## 2022-12-01 DIAGNOSIS — I10 Essential (primary) hypertension: Secondary | ICD-10-CM | POA: Diagnosis not present

## 2022-12-01 DIAGNOSIS — R7301 Impaired fasting glucose: Secondary | ICD-10-CM | POA: Diagnosis not present

## 2022-12-01 DIAGNOSIS — Z0000 Encounter for general adult medical examination without abnormal findings: Secondary | ICD-10-CM | POA: Diagnosis not present

## 2022-12-11 DIAGNOSIS — M25512 Pain in left shoulder: Secondary | ICD-10-CM | POA: Diagnosis not present

## 2022-12-17 DIAGNOSIS — M25612 Stiffness of left shoulder, not elsewhere classified: Secondary | ICD-10-CM | POA: Diagnosis not present

## 2022-12-17 DIAGNOSIS — S46012S Strain of muscle(s) and tendon(s) of the rotator cuff of left shoulder, sequela: Secondary | ICD-10-CM | POA: Diagnosis not present

## 2023-05-19 DIAGNOSIS — F330 Major depressive disorder, recurrent, mild: Secondary | ICD-10-CM | POA: Diagnosis not present

## 2023-05-19 DIAGNOSIS — F411 Generalized anxiety disorder: Secondary | ICD-10-CM | POA: Diagnosis not present

## 2023-06-03 DIAGNOSIS — I700 Atherosclerosis of aorta: Secondary | ICD-10-CM | POA: Diagnosis not present

## 2023-06-03 DIAGNOSIS — E7800 Pure hypercholesterolemia, unspecified: Secondary | ICD-10-CM | POA: Diagnosis not present

## 2023-06-03 DIAGNOSIS — I10 Essential (primary) hypertension: Secondary | ICD-10-CM | POA: Diagnosis not present

## 2023-07-14 DIAGNOSIS — Z1231 Encounter for screening mammogram for malignant neoplasm of breast: Secondary | ICD-10-CM | POA: Diagnosis not present

## 2023-07-14 DIAGNOSIS — Z01419 Encounter for gynecological examination (general) (routine) without abnormal findings: Secondary | ICD-10-CM | POA: Diagnosis not present

## 2023-07-14 DIAGNOSIS — Z130 Encounter for screening for diseases of the blood and blood-forming organs and certain disorders involving the immune mechanism: Secondary | ICD-10-CM | POA: Diagnosis not present

## 2023-07-16 DIAGNOSIS — L03031 Cellulitis of right toe: Secondary | ICD-10-CM | POA: Diagnosis not present

## 2023-07-17 ENCOUNTER — Emergency Department (HOSPITAL_COMMUNITY): Payer: BC Managed Care – PPO

## 2023-07-17 ENCOUNTER — Emergency Department (HOSPITAL_COMMUNITY)
Admission: EM | Admit: 2023-07-17 | Discharge: 2023-07-17 | Disposition: A | Discharge status: Home / Self Care | Payer: BC Managed Care – PPO | Attending: Emergency Medicine | Admitting: Emergency Medicine

## 2023-07-17 ENCOUNTER — Encounter (HOSPITAL_COMMUNITY): Payer: Self-pay | Admitting: Emergency Medicine

## 2023-07-17 DIAGNOSIS — B353 Tinea pedis: Secondary | ICD-10-CM | POA: Insufficient documentation

## 2023-07-17 DIAGNOSIS — B369 Superficial mycosis, unspecified: Secondary | ICD-10-CM | POA: Diagnosis not present

## 2023-07-17 DIAGNOSIS — L03115 Cellulitis of right lower limb: Secondary | ICD-10-CM | POA: Diagnosis not present

## 2023-07-17 DIAGNOSIS — L03031 Cellulitis of right toe: Secondary | ICD-10-CM | POA: Diagnosis not present

## 2023-07-17 DIAGNOSIS — E119 Type 2 diabetes mellitus without complications: Secondary | ICD-10-CM | POA: Insufficient documentation

## 2023-07-17 DIAGNOSIS — I10 Essential (primary) hypertension: Secondary | ICD-10-CM | POA: Diagnosis not present

## 2023-07-17 DIAGNOSIS — M7731 Calcaneal spur, right foot: Secondary | ICD-10-CM | POA: Diagnosis not present

## 2023-07-17 LAB — COMPREHENSIVE METABOLIC PANEL
ALT: 13 U/L (ref 0–44)
AST: 14 U/L — ABNORMAL LOW (ref 15–41)
Albumin: 3.7 g/dL (ref 3.5–5.0)
Alkaline Phosphatase: 75 U/L (ref 38–126)
Anion gap: 9 (ref 5–15)
BUN: 15 mg/dL (ref 6–20)
CO2: 24 mmol/L (ref 22–32)
Calcium: 9 mg/dL (ref 8.9–10.3)
Chloride: 104 mmol/L (ref 98–111)
Creatinine, Ser: 0.69 mg/dL (ref 0.44–1.00)
GFR, Estimated: >60 mL/min (ref >60)
Glucose, Bld: 94 mg/dL (ref 70–99)
Potassium: 3.4 mmol/L — ABNORMAL LOW (ref 3.5–5.1)
Sodium: 137 mmol/L (ref 135–145)
Total Bilirubin: 0.5 mg/dL (ref 0.0–1.2)
Total Protein: 7.8 g/dL (ref 6.5–8.1)

## 2023-07-17 LAB — CBC WITH DIFFERENTIAL/PLATELET
Abs Immature Granulocytes: 0.01 10*3/uL (ref 0.00–0.07)
Basophils Absolute: 0.1 10*3/uL (ref 0.0–0.1)
Basophils Relative: 1 %
Eosinophils Absolute: 0.2 10*3/uL (ref 0.0–0.5)
Eosinophils Relative: 3 %
HCT: 37.3 % (ref 36.0–46.0)
Hemoglobin: 11.5 g/dL — ABNORMAL LOW (ref 12.0–15.0)
Immature Granulocytes: 0 %
Lymphocytes Relative: 30 %
Lymphs Abs: 1.9 10*3/uL (ref 0.7–4.0)
MCH: 27.3 pg (ref 26.0–34.0)
MCHC: 30.8 g/dL (ref 30.0–36.0)
MCV: 88.4 fL (ref 80.0–100.0)
Monocytes Absolute: 0.5 10*3/uL (ref 0.1–1.0)
Monocytes Relative: 7 %
Neutro Abs: 3.7 10*3/uL (ref 1.7–7.7)
Neutrophils Relative %: 59 %
Platelets: 287 10*3/uL (ref 150–400)
RBC: 4.22 MIL/uL (ref 3.87–5.11)
RDW: 14.4 % (ref 11.5–15.5)
WBC: 6.4 10*3/uL (ref 4.0–10.5)
nRBC: 0 % (ref 0.0–0.2)

## 2023-07-17 LAB — I-STAT CG4 LACTIC ACID, ED: Lactic Acid, Venous: 0.8 mmol/L (ref 0.5–1.9)

## 2023-07-17 MED ORDER — CLOTRIMAZOLE 1 % EX CREA: TOPICAL_CREAM | CUTANEOUS | 0 refills | Status: DC

## 2023-07-17 NOTE — ED Provider Notes (Signed)
 [place] EMERGENCY DEPARTMENT AT [facility] Provider Note   CSN: [id number] Arrival date & time: 07/17/23  1629     History  Chief Complaint  Patient presents with   Cellulitis    Sara Andersen is a 63 y.o. female.  Patient with an infection between 2 of her right toes.  Second and third.  Patient has been going for about 3 weeks.  Patient seen in urgent care yesterday started on clindamycin.  They were worried about osteomyelitis so she came in for x-ray of the foot.  That was done out in triage and showed no bony abnormalities.  Temp here 98.4 oxygen sats 98% blood pressure 170/101 heart rate 95.  Respirations 18.  Past medical history significant hyperlipidemia hypertension and diabetes without complications.       Home Medications Prior to Admission medications   Not on File      Allergies    Ciprofloxacin and Sulfa antibiotics    Review of Systems   Review of Systems  Constitutional:  Negative for chills and fever.  HENT:  Negative for ear pain and sore throat.   Eyes:  Negative for pain and visual disturbance.  Respiratory:  Negative for cough and shortness of breath.   Cardiovascular:  Negative for chest pain and palpitations.  Gastrointestinal:  Negative for abdominal pain and vomiting.  Genitourinary:  Negative for dysuria and hematuria.  Musculoskeletal:  Negative for arthralgias and back pain.  Skin:  Positive for wound. Negative for color change and rash.  Neurological:  Negative for seizures and syncope.  All other systems reviewed and are negative.   Physical Exam Updated Vital Signs BP (!) 170/109 (BP Location: Left Arm)   Pulse 95   Temp 98.4 F (36.9 C) (Oral)   Resp 18   Ht 1.575 m (5' 2)   Wt 95.3 kg   SpO2 98%   BMI 38.41 kg/m  Physical Exam Vitals and nursing note reviewed.  Constitutional:      General: She is not in acute distress.    Appearance: She is well-developed.  HENT:     Head: Normocephalic and atraumatic.   Eyes:     Conjunctiva/sclera: Conjunctivae normal.  Cardiovascular:     Rate and Rhythm: Normal rate and regular rhythm.     Heart sounds: No murmur heard. Pulmonary:     Effort: Pulmonary effort is normal. No respiratory distress.     Breath sounds: Normal breath sounds.  Abdominal:     Palpations: Abdomen is soft.     Tenderness: There is no abdominal tenderness.  Musculoskeletal:        General: Swelling present.     Cervical back: Neck supple.     Comments: Right foot between to the toes there is a lot of skin excoriation and some redness to the toe itself no proximal streaking.  Good cap refill.  Skin:    General: Skin is warm and dry.     Capillary Refill: Capillary refill takes less than 2 seconds.  Neurological:     Mental Status: She is alert.  Psychiatric:        Mood and Affect: Mood normal.     ED Results / Procedures / Treatments   Labs (all labs ordered are listed, but only abnormal results are displayed) Labs Reviewed  COMPREHENSIVE METABOLIC PANEL - Abnormal; Notable for the following components:      Result Value   Potassium 3.4 (*)    AST 14 (*)  All other components within normal limits  CBC WITH DIFFERENTIAL/PLATELET - Abnormal; Notable for the following components:   Hemoglobin 11.5 (*)    All other components within normal limits  I-STAT CG4 LACTIC ACID, ED    EKG None  Radiology DG Foot Complete Right Result Date: 07/17/2023 CLINICAL DATA:  Cellulitis EXAM: RIGHT FOOT COMPLETE - 3+ VIEW COMPARISON:  None Available. FINDINGS: No acute bony abnormality. Specifically, no fracture, subluxation, or dislocation. No bone destruction to suggest osteomyelitis. Plantar and posterior calcaneal spurs. Soft tissues are intact. IMPRESSION: No acute bony abnormality. Electronically Signed   By: Franky Crease M.D.   On: 07/17/2023 19:10    Procedures Procedures    Medications Ordered in ED Medications - No data to display  ED Course/ Medical Decision  Making/ A&P                                 Medical Decision Making Amount and/or Complexity of Data Reviewed Labs: ordered. Radiology: ordered.   Labs here today lactic acid was normal at 0.8 complete metabolic panel normal renal function GFR greater than 60 blood sugar was 94 very reassuring.  Of CBC white count 6.4 hemoglobin 11.5 platelets 287.  X-ray of the foot no bony abnormalities no evidence of any osteomyelitis.  Would recommend soaking the foot in warm water for 20 minutes and pat dry then applying an antifungal cream.  Continue the clindamycin make an appointment with [facility].  Patient given precautions return for any new or worse symptoms. Final Clinical Impression(s) / ED Diagnoses Final diagnoses:  Fungal infection right foot    Rx / DC Orders ED Discharge Orders     None         Geraldene Hamilton, MD 07/17/23 2146

## 2023-07-17 NOTE — ED Triage Notes (Signed)
 Pt reports yeast infection in between right toes. Pt seen at [facility] yesterday and worried for cellulitis and started on clindamycin. Pt told to follow-up to rule out osteomyelitis.

## 2023-07-17 NOTE — Discharge Instructions (Signed)
 Continue the clindamycin.  Would also recommend applying Lotrimin  antifungal between the toes and then put gauze there prior to doing that would soak it in warm water for about 20 minutes and pat dry and then apply these things.  On Monday give [facility] call for follow-up.  Return for any red streaking or redness going into the foot area.

## 2023-08-03 ENCOUNTER — Ambulatory Visit (INDEPENDENT_AMBULATORY_CARE_PROVIDER_SITE_OTHER): Payer: BC Managed Care – PPO | Admitting: Podiatry

## 2023-08-03 ENCOUNTER — Encounter: Payer: Self-pay | Admitting: Podiatry

## 2023-08-03 VITALS — Ht 62.0 in | Wt 215.0 lb

## 2023-08-03 DIAGNOSIS — B353 Tinea pedis: Secondary | ICD-10-CM

## 2023-08-03 DIAGNOSIS — L03115 Cellulitis of right lower limb: Secondary | ICD-10-CM | POA: Diagnosis not present

## 2023-08-03 DIAGNOSIS — B351 Tinea unguium: Secondary | ICD-10-CM

## 2023-08-03 MED ORDER — CICLOPIROX 8 % EX SOLN: Freq: Every day | CUTANEOUS | 11 refills | Status: DC

## 2023-08-03 MED ORDER — KETOCONAZOLE 2 % EX CREA: 1.0000 | TOPICAL_CREAM | Freq: Every day | CUTANEOUS | 2 refills | Status: DC

## 2023-08-03 MED ORDER — DOXYCYCLINE HYCLATE 100 MG PO CAPS: 100.0000 mg | ORAL_CAPSULE | Freq: Two times a day (BID) | ORAL | 0 refills | Status: AC

## 2023-08-03 NOTE — Progress Notes (Signed)
Chief Complaint  Patient presents with   Tinea Pedis    "I think I may have some athletes foot on the right foot" she has been using lotrium on the area to get it checked"   HPI: 63 y.o. female presents today with concern of possible athlete's foot to the right foot.  States that is isolated to the second and third toes. She has been seen by [facility], as well as the emergency department for this issue in early January.  States that x-rays were obtained in the ED and was told that they were negative for osteomyelitis or injury to the bones.  These are able to be visualized today.  She was placed on over-the-counter topical Lamisil cream as well as an antibiotic which she finished at least 1 week ago.  And notes that the area looks slightly better since she was seen in the emergency department  Past Medical History:  Diagnosis Date   Diabetes mellitus without complication ([facility])    Hyperlipidemia    Hypertension     No past surgical history on file.  Allergies  Allergen Reactions   Meperidine Rash and Other (See Comments)   Molds & Smuts Other (See Comments)    And dust   And dust   Pollen Extract Other (See Comments)   Ciprofloxacin Nausea Only   Sulfa Antibiotics Nausea Only    Physical Exam: There are palpable pedal pulses to the right foot.  There is localized edema and erythema to the right second and third toes near the proximal phalanx and MPJs.  There are some eschar formation in the associated interspace.  No active drainage is noted today.  There is some peeling and erythema noted between the right second and third toes.  The right second toenail is 3 mm thick with yellow discoloration, subungual debris, distal onycholysis, and pain with compression.  Assessment/Plan of Care: 1. Tinea pedis of right foot   2. Cellulitis of right foot   3. Dermatophytosis of nail      Meds ordered this encounter  Medications   ciclopirox (PENLAC) 8 % solution    Sig: Apply  topically at bedtime. Apply thin layer over affected toenail. Apply daily over previous coat. Remove weekly with polish remover.    Dispense:  6.6 mL    Refill:  11   ketoconazole (NIZORAL) 2 % cream    Sig: Apply 1 Application topically daily. Apply 1gm between affected toes on right foot once or twice daily.    Dispense:  60 g    Refill:  2   doxycycline (VIBRAMYCIN) 100 MG capsule    Sig: Take 1 capsule (100 mg total) by mouth 2 (two) times daily for 7 days.    Dispense:  14 capsule    Refill:  0   Discussed clinical findings with patient today.  Informed patient that I personally reviewed her foot x-rays from the ED on 07/17/2023 and agree that there are no signs of bone infection at this time.  I will start the patient on doxycycline 100 mg 1 capsule p.o. twice daily for 7 days for the localized cellulitis.  I will send in a prescription ketoconazole 2% cream to apply daily between the affected toes and on the toes of the right foot for the interdigital tinea pedis.  Prescription for ciclopirox solution to apply to the right second toenail once daily for up to 10 months to treat the right second toenail fungus.  Follow-up in  1 month for recheck   Heyden Jaber Orland Mustard, DPM, FACFAS [facility]     [address] [place], [place] [address]                Office [phone]  Fax [phone]

## 2023-08-10 ENCOUNTER — Telehealth: Payer: Self-pay

## 2023-08-10 NOTE — Telephone Encounter (Signed)
PA request received from covermymeds for Ciclopirox 8% solution. PA submitted through covermymeds and waiting response.  Sara Andersen  (Key: [id number])

## 2023-08-31 ENCOUNTER — Ambulatory Visit (INDEPENDENT_AMBULATORY_CARE_PROVIDER_SITE_OTHER): Payer: BC Managed Care – PPO | Admitting: Podiatry

## 2023-08-31 DIAGNOSIS — B353 Tinea pedis: Secondary | ICD-10-CM

## 2023-08-31 DIAGNOSIS — B351 Tinea unguium: Secondary | ICD-10-CM

## 2023-08-31 MED ORDER — CICLOPIROX 8 % EX SOLN: Freq: Every day | CUTANEOUS | 11 refills | Status: AC

## 2023-08-31 NOTE — Progress Notes (Unsigned)
     Chief Complaint  Patient presents with   Tinea Pedis    Pt stated that she is doing better    HPI: 63 y.o. female presents today for f/u of tinea pedis.  She finished the oral abx's for the cellulitis and is using the Rx ketoconazole cream.  The pharmacy did not give her the Rx ciclopirox solution, so she has not started with that yet for the right 2nd toenail.  2nd interspaces are affected with tinea.  She feels it is getting better now.    Past Medical History:  Diagnosis Date   Diabetes mellitus without complication ([facility])    Hyperlipidemia    Hypertension     No past surgical history on file.  Allergies  Allergen Reactions   Meperidine Rash and Other (See Comments)   Molds & Smuts Other (See Comments)    And dust   And dust   Pollen Extract Other (See Comments)   Ciprofloxacin Nausea Only   Sulfa Antibiotics Nausea Only    Physical Exam: Bilateral 2nd interspace has superficial fissure noted.  There is mild erythema and peeling skin along dorsal 2nd interspace area.  Improved from last visit.  No edema.  Right 2nd toenail unchanged from 08/03/23 visit  Assessment/Plan of Care: 1. Tinea pedis of both feet   2. Dermatophytosis of nail      Meds ordered this encounter  Medications   ciclopirox (PENLAC) 8 % solution    Sig: Apply topically at bedtime. Apply thin layer over affected toenail. Apply daily over previous coat. Remove weekly with polish remover.    Dispense:  6.6 mL    Refill:  11    Patient states she did not receive this with her prior order when two other meds had been sent in.   Discussed clinical findings with patient today.  Informed patient the skin looks better, but there are still fissures noted.  Treat the fissures with small amount of abx ointment, and continue with the ketoconazole cream to the rest of the affected areas.  Resent the ciclopirox solution Rx with note to pharmacist that patient claims she did not get this with the original  pharmacy pickup.    F/u 4 weeks.   Clerance Lav, DPM, FACFAS [facility]     [address] [place], [place] [address]                Office [phone]  Fax [phone]

## 2023-09-04 NOTE — Telephone Encounter (Signed)
PA was denied for ciclopirox.

## 2023-09-06 ENCOUNTER — Encounter: Payer: Self-pay | Admitting: Podiatry

## 2023-10-05 ENCOUNTER — Ambulatory Visit (INDEPENDENT_AMBULATORY_CARE_PROVIDER_SITE_OTHER): Payer: BC Managed Care – PPO | Admitting: Podiatry

## 2023-10-05 ENCOUNTER — Encounter: Payer: Self-pay | Admitting: Podiatry

## 2023-10-05 DIAGNOSIS — B353 Tinea pedis: Secondary | ICD-10-CM

## 2023-10-05 DIAGNOSIS — B351 Tinea unguium: Secondary | ICD-10-CM

## 2023-10-05 MED ORDER — CICLOPIROX OLAMINE 0.77 % EX CREA: TOPICAL_CREAM | CUTANEOUS | 1 refills | Status: AC

## 2023-10-05 NOTE — Progress Notes (Unsigned)
      Chief Complaint  Patient presents with   Nail Problem    "They're doing well.  I just wish they would go ahead and clear on up."   HPI: 63 y.o. female presents today for follow-up of bilateral second interspace tinea pedis.  She states that there has overall been improvement.  She has been applying ketoconazole cream to the area but it does not seem to be clearing it up completely.  Other prescription topicals are not covered by her insurance and too expensive.  She is using the ciclopirox nail lacquer on the right second toenail.  She denies itching at this time  Past Medical History:  Diagnosis Date   Diabetes mellitus without complication (HCC)    Hyperlipidemia    Hypertension     No past surgical history on file.  Allergies  Allergen Reactions   Meperidine Rash and Other (See Comments)   Molds & Smuts Other (See Comments)    And dust   And dust   Pollen Extract Other (See Comments)   Ciprofloxacin Nausea Only   Sulfa Antibiotics Nausea Only     Physical Exam: Palpable pedal pulses noted.  There is minimal localized erythema with crusty formation of the skin in the second interspace bilateral.  No deep fissures are noted at this time.  No drainage is noted.  No blisters are seen.  Right second toenail still has some fungal involvement  Assessment/Plan of Care: 1. Tinea pedis of both feet   2. Dermatophytosis of nail      Meds ordered this encounter  Medications   ciclopirox (LOPROX) 0.77 % cream    Sig: Apply between toes twice daily for 4 weeks.    Dispense:  30 g    Refill:  1    Patient plans to use her GoodRx coupon to get this medication   Discussed clinical findings with patient today.  While the patient was in the office, we looked up GoodRx medications and it appears that she can get the Loprox and the cream version (generic) for less than $50 to be able to apply to the interdigital spaces.  She was informed that the Loprox cream will not penetrate  into the nail and be as effective send nail lacquer.  The nail lacquer should not be used on the skin as it often can become a skin irritant due to the nature of the lacquer./She agreed to try the ciclopirox cream and this was sent to the pharmacy with a note to the pharmacist that the patient plans on using her GoodRx coupon to obtain this at a discount.  Follow-up in approximately 6 weeks   Karlisha Mathena DBurna Mortimer, DPM, FACFAS [facility]     [address] [place], [place] [address]                Office [phone]  Fax [phone]

## 2023-11-10 DIAGNOSIS — F330 Major depressive disorder, recurrent, mild: Secondary | ICD-10-CM | POA: Diagnosis not present

## 2023-11-10 DIAGNOSIS — F411 Generalized anxiety disorder: Secondary | ICD-10-CM | POA: Diagnosis not present

## 2023-12-14 DIAGNOSIS — E7800 Pure hypercholesterolemia, unspecified: Secondary | ICD-10-CM | POA: Diagnosis not present

## 2023-12-14 DIAGNOSIS — F418 Other specified anxiety disorders: Secondary | ICD-10-CM | POA: Diagnosis not present

## 2023-12-14 DIAGNOSIS — Z0000 Encounter for general adult medical examination without abnormal findings: Secondary | ICD-10-CM | POA: Diagnosis not present

## 2023-12-14 DIAGNOSIS — I10 Essential (primary) hypertension: Secondary | ICD-10-CM | POA: Diagnosis not present

## 2023-12-14 DIAGNOSIS — R7301 Impaired fasting glucose: Secondary | ICD-10-CM | POA: Diagnosis not present

## 2024-01-11 DIAGNOSIS — S61251A Open bite of left index finger without damage to nail, initial encounter: Secondary | ICD-10-CM | POA: Diagnosis not present

## 2024-01-14 DIAGNOSIS — I10 Essential (primary) hypertension: Secondary | ICD-10-CM | POA: Diagnosis not present

## 2024-02-04 DIAGNOSIS — I10 Essential (primary) hypertension: Secondary | ICD-10-CM | POA: Diagnosis not present

## 2024-05-04 DIAGNOSIS — F330 Major depressive disorder, recurrent, mild: Secondary | ICD-10-CM | POA: Diagnosis not present

## 2024-05-04 DIAGNOSIS — F411 Generalized anxiety disorder: Secondary | ICD-10-CM | POA: Diagnosis not present

## 2024-05-07 DIAGNOSIS — L089 Local infection of the skin and subcutaneous tissue, unspecified: Secondary | ICD-10-CM | POA: Diagnosis not present

## 2024-05-07 DIAGNOSIS — L03031 Cellulitis of right toe: Secondary | ICD-10-CM | POA: Diagnosis not present

## 2024-05-07 DIAGNOSIS — R21 Rash and other nonspecific skin eruption: Secondary | ICD-10-CM | POA: Diagnosis not present

## 2024-06-17 DIAGNOSIS — R7301 Impaired fasting glucose: Secondary | ICD-10-CM | POA: Diagnosis not present

## 2024-06-17 DIAGNOSIS — I10 Essential (primary) hypertension: Secondary | ICD-10-CM | POA: Diagnosis not present

## 2024-06-17 DIAGNOSIS — E7800 Pure hypercholesterolemia, unspecified: Secondary | ICD-10-CM | POA: Diagnosis not present

## 2024-06-19 DIAGNOSIS — R059 Cough, unspecified: Secondary | ICD-10-CM | POA: Diagnosis not present
